# Patient Record
Sex: Male | Born: 1970 | Race: White | Hispanic: No | Marital: Married | Smoking: Current every day smoker
Health system: Southern US, Community
[De-identification: ages and names within clinical notes are randomized; demographics above are authoritative.]

## PROBLEM LIST (undated history)

## (undated) DIAGNOSIS — I2510 Atherosclerotic heart disease of native coronary artery without angina pectoris: Secondary | ICD-10-CM

## (undated) DIAGNOSIS — Z720 Tobacco use: Secondary | ICD-10-CM

## (undated) DIAGNOSIS — Z9289 Personal history of other medical treatment: Secondary | ICD-10-CM

## (undated) DIAGNOSIS — I255 Ischemic cardiomyopathy: Secondary | ICD-10-CM

## (undated) DIAGNOSIS — M5030 Other cervical disc degeneration, unspecified cervical region: Secondary | ICD-10-CM

## (undated) HISTORY — DX: Tobacco use: Z72.0

## (undated) HISTORY — DX: Ischemic cardiomyopathy: I25.5

## (undated) HISTORY — PX: COSMETIC SURGERY: SHX468

## (undated) HISTORY — DX: Atherosclerotic heart disease of native coronary artery without angina pectoris: I25.10

## (undated) HISTORY — DX: Personal history of other medical treatment: Z92.89

## (undated) HISTORY — DX: Other cervical disc degeneration, unspecified cervical region: M50.30

---

## 1999-09-13 ENCOUNTER — Emergency Department (HOSPITAL_COMMUNITY): Admission: EM | Admit: 1999-09-13 | Discharge: 1999-09-13 | Payer: Self-pay | Admitting: Emergency Medicine

## 2000-03-02 ENCOUNTER — Emergency Department (HOSPITAL_COMMUNITY): Admission: EM | Admit: 2000-03-02 | Discharge: 2000-03-02 | Payer: Self-pay | Admitting: Emergency Medicine

## 2002-09-19 ENCOUNTER — Emergency Department (HOSPITAL_COMMUNITY): Admission: EM | Admit: 2002-09-19 | Discharge: 2002-09-19 | Payer: Self-pay | Admitting: Podiatry

## 2002-11-05 ENCOUNTER — Emergency Department (HOSPITAL_COMMUNITY): Admission: EM | Admit: 2002-11-05 | Discharge: 2002-11-05 | Payer: Self-pay | Admitting: Emergency Medicine

## 2003-08-14 ENCOUNTER — Encounter: Payer: Self-pay | Admitting: Emergency Medicine

## 2003-08-14 ENCOUNTER — Emergency Department (HOSPITAL_COMMUNITY): Admission: AD | Admit: 2003-08-14 | Discharge: 2003-08-14 | Payer: Self-pay | Admitting: Emergency Medicine

## 2005-04-18 ENCOUNTER — Emergency Department (HOSPITAL_COMMUNITY): Admission: EM | Admit: 2005-04-18 | Discharge: 2005-04-18 | Payer: Self-pay | Admitting: Emergency Medicine

## 2006-02-14 ENCOUNTER — Emergency Department (HOSPITAL_COMMUNITY): Admission: EM | Admit: 2006-02-14 | Discharge: 2006-02-14 | Payer: Self-pay | Admitting: Emergency Medicine

## 2007-02-06 ENCOUNTER — Emergency Department (HOSPITAL_COMMUNITY): Admission: EM | Admit: 2007-02-06 | Discharge: 2007-02-07 | Payer: Self-pay | Admitting: Emergency Medicine

## 2007-02-12 ENCOUNTER — Emergency Department (HOSPITAL_COMMUNITY): Admission: EM | Admit: 2007-02-12 | Discharge: 2007-02-12 | Payer: Self-pay | Admitting: Emergency Medicine

## 2008-12-08 ENCOUNTER — Emergency Department (HOSPITAL_COMMUNITY): Admission: EM | Admit: 2008-12-08 | Discharge: 2008-12-08 | Payer: Self-pay | Admitting: Emergency Medicine

## 2010-06-16 ENCOUNTER — Emergency Department (HOSPITAL_COMMUNITY): Admission: EM | Admit: 2010-06-16 | Discharge: 2010-06-16 | Payer: Self-pay | Admitting: Emergency Medicine

## 2010-06-21 ENCOUNTER — Emergency Department (HOSPITAL_COMMUNITY): Admission: EM | Admit: 2010-06-21 | Discharge: 2010-06-21 | Payer: Self-pay | Admitting: Emergency Medicine

## 2010-12-17 ENCOUNTER — Emergency Department (HOSPITAL_COMMUNITY)
Admission: EM | Admit: 2010-12-17 | Discharge: 2010-12-17 | Payer: Self-pay | Source: Home / Self Care | Admitting: Emergency Medicine

## 2010-12-21 LAB — GC/CHLAMYDIA PROBE AMP, GENITAL: Chlamydia, DNA Probe: NEGATIVE

## 2015-04-06 ENCOUNTER — Emergency Department (INDEPENDENT_AMBULATORY_CARE_PROVIDER_SITE_OTHER)
Admission: EM | Admit: 2015-04-06 | Discharge: 2015-04-06 | Disposition: A | Discharge status: Home / Self Care | Payer: Self-pay | Source: Home / Self Care | Attending: Family Medicine | Admitting: Family Medicine

## 2015-04-06 ENCOUNTER — Encounter (HOSPITAL_COMMUNITY): Payer: Self-pay | Admitting: Emergency Medicine

## 2015-04-06 ENCOUNTER — Emergency Department (INDEPENDENT_AMBULATORY_CARE_PROVIDER_SITE_OTHER): Payer: Self-pay

## 2015-04-06 DIAGNOSIS — J40 Bronchitis, not specified as acute or chronic: Secondary | ICD-10-CM

## 2015-04-06 MED ORDER — IPRATROPIUM-ALBUTEROL 0.5-2.5 (3) MG/3ML IN SOLN
3.0000 mL | Freq: Once | RESPIRATORY_TRACT | Status: AC
  Administered 2015-04-06: 3 mL via RESPIRATORY_TRACT

## 2015-04-06 MED ORDER — ALBUTEROL SULFATE HFA 108 (90 BASE) MCG/ACT IN AERS: 2.0000 | INHALATION_SPRAY | Freq: Four times a day (QID) | RESPIRATORY_TRACT | Status: DC | PRN

## 2015-04-06 MED ORDER — IPRATROPIUM-ALBUTEROL 0.5-2.5 (3) MG/3ML IN SOLN
RESPIRATORY_TRACT | Status: AC
  Filled 2015-04-06: qty 3

## 2015-04-06 MED ORDER — PREDNISONE 5 MG PO TABS: 30.0000 mg | ORAL_TABLET | Freq: Every day | ORAL | Status: DC

## 2015-04-06 NOTE — Discharge Instructions (Signed)
Thank you for coming in today. Call or go to the emergency room if you get worse, have trouble breathing, have chest pains, or palpitations.  Please call or see Ms Antionette CharMaggy Mena for assistance with your bill.  You may qualify for reduced or free services.  Her phone number is [phone]. Her email is [email or web address] Please quit smoking.   Acute Bronchitis Bronchitis is inflammation of the airways that extend from the windpipe into the lungs (bronchi). The inflammation often causes mucus to develop. This leads to a cough, which is the most common symptom of bronchitis.  In acute bronchitis, the condition usually develops suddenly and goes away over time, usually in a couple weeks. Smoking, allergies, and asthma can make bronchitis worse. Repeated episodes of bronchitis may cause further lung problems.  CAUSES Acute bronchitis is most often caused by the same virus that causes a cold. The virus can spread from person to person (contagious) through coughing, sneezing, and touching contaminated objects. SIGNS AND SYMPTOMS   Cough.   Fever.   Coughing up mucus.   Body aches.   Chest congestion.   Chills.   Shortness of breath.   Sore throat.  DIAGNOSIS  Acute bronchitis is usually diagnosed through a physical exam. Your health care provider will also ask you questions about your medical history. Tests, such as chest X-rays, are sometimes done to rule out other conditions.  TREATMENT  Acute bronchitis usually goes away in a couple weeks. Oftentimes, no medical treatment is necessary. Medicines are sometimes given for relief of fever or cough. Antibiotic medicines are usually not needed but may be prescribed in certain situations. In some cases, an inhaler may be recommended to help reduce shortness of breath and control the cough. A cool mist vaporizer may also be used to help thin bronchial secretions and make it easier to clear the chest.  HOME CARE  INSTRUCTIONS  Get plenty of rest.   Drink enough fluids to keep your urine clear or pale yellow (unless you have a medical condition that requires fluid restriction). Increasing fluids may help thin your respiratory secretions (sputum) and reduce chest congestion, and it will prevent dehydration.   Take medicines only as directed by your health care provider.  If you were prescribed an antibiotic medicine, finish it all even if you start to feel better.  Avoid smoking and secondhand smoke. Exposure to cigarette smoke or irritating chemicals will make bronchitis worse. If you are a smoker, consider using nicotine gum or skin patches to help control withdrawal symptoms. Quitting smoking will help your lungs heal faster.   Reduce the chances of another bout of acute bronchitis by washing your hands frequently, avoiding people with cold symptoms, and trying not to touch your hands to your mouth, nose, or eyes.   Keep all follow-up visits as directed by your health care provider.  SEEK MEDICAL CARE IF: Your symptoms do not improve after 1 week of treatment.  SEEK IMMEDIATE MEDICAL CARE IF:  You develop an increased fever or chills.   You have chest pain.   You have severe shortness of breath.  You have bloody sputum.   You develop dehydration.  You faint or repeatedly feel like you are going to pass out.  You develop repeated vomiting.  You develop a severe headache. MAKE SURE YOU:   Understand these instructions.  Will watch your condition.  Will get help right away if you are not doing well or get worse. Document Released: 12/22/2004 Document Revised: 03/31/2014  Document Reviewed: 05/07/2013 [facility] Patient Information 2015 [place], [place]. This information is not intended to replace advice given to you by your health care provider. Make sure you discuss any questions you have with your health care provider.

## 2015-04-06 NOTE — ED Provider Notes (Signed)
Henry Walters is a 44 y.o. male who presents to Urgent Care today for cough. Patient is a 41 history of chest congestion nasal congestion and runny nose cough and wheezing. A few days ago he had central nonradiating nonexertional chest pain. 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 He denies any current shortness of breath or palpitations. No fevers or chills. He is a current smoker.   History reviewed. No pertinent past medical history. History reviewed. No pertinent past surgical history. History  Substance Use Topics  . Smoking status: Current Every Day Smoker  . Smokeless tobacco: Not on file  . Alcohol Use: No   ROS as above Medications: No current facility-administered medications for this encounter.   Current Outpatient Prescriptions  Medication Sig Dispense Refill  . albuterol (PROVENTIL HFA;VENTOLIN HFA) 108 (90 BASE) MCG/ACT inhaler Inhale 2 puffs into the lungs every 6 (six) hours as needed for wheezing or shortness of breath. 1 Inhaler 2  . predniSONE (DELTASONE) 5 MG tablet Take 6 tablets (30 mg total) by mouth daily. 30 tablet 0   No Known Allergies   Exam:  BP 128/76 mmHg  Pulse 76  Temp(Src) 98.8 F (37.1 C) (Oral)  Resp 18  SpO2 98% Gen: Well NAD HEENT: EOMI,  MMM posterior pharynx with cobblestoning. Normal tympanic membranes bilaterally. Lungs: Normal work of breathing. CTABL Heart: RRR no MRG Abd: NABS, Soft. Nondistended, Nontender Exts: Brisk capillary refill, warm and well perfused.   Patient was given a 2.5/0.5 mg DuoNeb nebulizer treatment and felt better  ED ECG REPORT   Date: 04/06/2015  Rate: 78  Rhythm: normal sinus rhythm and sinus arrhythmia  QRS Axis: normal  Intervals: normal  ST/T Wave abnormalities: nonspecific T wave changes and Flattened lateral T waves  Conduction Disutrbances:none  Narrative Interpretation:   Old EKG Reviewed: none available  I have personally reviewed the EKG tracing and  agree with the computerized printout as noted.  No results found for this or any previous visit (from the past 24 hour(s)). Dg Chest 2 View  04/06/2015   CLINICAL DATA:  Cough since Friday.  Right lung crackles.  EXAM: CHEST  2 VIEW  COMPARISON:  None.  FINDINGS: Normal heart size and mediastinal contours. No acute infiltrate or edema. No effusion or pneumothorax. No acute osseous findings.  IMPRESSION: Negative chest.   Electronically Signed   By: Marnee SpringJonathon  Watts M.D.   On: 04/06/2015 09:47    Assessment and Plan: 44 y.o. male with bronchitis. Treat with prednisone and albuterol. Follow-up with cardiology regarding EKG changes. Return as needed.   Discussed warning signs or symptoms. Please see discharge instructions. Patient expresses understanding.     Rodolph BongEvan S Criselda Starke, MD 04/06/15 [id number]

## 2015-04-06 NOTE — ED Notes (Signed)
C/o symptoms starting Thursday 5/5.  Chest soreness, stuffy head, sore throat, green productive cough. No pain or sob today, but prior to today has had episodes of sharp, intermittent, sob increased with activity.  Head very stuffy today

## 2015-04-17 ENCOUNTER — Ambulatory Visit (INDEPENDENT_AMBULATORY_CARE_PROVIDER_SITE_OTHER): Payer: Self-pay | Admitting: Cardiology

## 2015-04-17 ENCOUNTER — Encounter: Payer: Self-pay | Admitting: Cardiology

## 2015-04-17 VITALS — BP 132/78 | HR 70 | Ht 71.0 in | Wt 206.1 lb

## 2015-04-17 DIAGNOSIS — R079 Chest pain, unspecified: Secondary | ICD-10-CM

## 2015-04-17 DIAGNOSIS — Z720 Tobacco use: Secondary | ICD-10-CM

## 2015-04-17 DIAGNOSIS — R0789 Other chest pain: Secondary | ICD-10-CM

## 2015-04-17 DIAGNOSIS — R9431 Abnormal electrocardiogram [ECG] [EKG]: Secondary | ICD-10-CM | POA: Insufficient documentation

## 2015-04-17 LAB — LIPID PANEL
CHOL/HDL RATIO: 6
Cholesterol: 178 mg/dL (ref 0–200)
HDL: 31.7 mg/dL — ABNORMAL LOW (ref >39.00)
NONHDL: 146.3
Triglycerides: 223 mg/dL — ABNORMAL HIGH (ref 0.0–149.0)
VLDL: 44.6 mg/dL — ABNORMAL HIGH (ref 0.0–40.0)

## 2015-04-17 LAB — LDL CHOLESTEROL, DIRECT: LDL DIRECT: 119 mg/dL

## 2015-04-17 LAB — BASIC METABOLIC PANEL
BUN: 12 mg/dL (ref 6–23)
CALCIUM: 9.5 mg/dL (ref 8.4–10.5)
CO2: 28 mEq/L (ref 19–32)
CREATININE: 0.81 mg/dL (ref 0.40–1.50)
Chloride: 102 mEq/L (ref 96–112)
GFR: 110.36 mL/min (ref >60.00)
Glucose, Bld: 75 mg/dL (ref 70–99)
Potassium: 4 mEq/L (ref 3.5–5.1)
SODIUM: 137 meq/L (ref 135–145)

## 2015-04-17 LAB — HEPATIC FUNCTION PANEL
ALT: 20 U/L (ref 0–53)
AST: 14 U/L (ref 0–37)
Albumin: 3.9 g/dL (ref 3.5–5.2)
Alkaline Phosphatase: 82 U/L (ref 39–117)
BILIRUBIN DIRECT: 0.1 mg/dL (ref 0.0–0.3)
Total Bilirubin: 0.5 mg/dL (ref 0.2–1.2)
Total Protein: 7 g/dL (ref 6.0–8.3)

## 2015-04-17 MED ORDER — NITROGLYCERIN 0.4 MG SL SUBL: 0.4000 mg | SUBLINGUAL_TABLET | SUBLINGUAL | Status: DC | PRN

## 2015-04-17 NOTE — Patient Instructions (Addendum)
Medication Instructions:  START ASPIRIN 81 MG DAILY  Use your NTG under your tongue for recurrent chest pain. May take one tablet every 5 minutes. If you are still having discomfort after 3 tablets in 15 minutes, call 911. RX SENT TO PHARMACY   Labwork: LP/BMET/HFP  Testing/Procedures: Your physician has requested that you have en exercise stress myoview. For further information please visit [email or web address]. Please follow instruction sheet, as given.  Follow-Up: AS NEEDED    Smoking Cessation Quitting smoking is important to your health and has many advantages. However, it is not always easy to quit since nicotine is a very addictive drug. Oftentimes, people try 3 times or more before being able to quit. This document explains the best ways for you to prepare to quit smoking. Quitting takes hard work and a lot of effort, but you can do it. ADVANTAGES OF QUITTING SMOKING  You will live longer, feel better, and live better.  Your body will feel the impact of quitting smoking almost immediately.  Within 20 minutes, blood pressure decreases. Your pulse returns to its normal level.  After 8 hours, carbon monoxide levels in the blood return to normal. Your oxygen level increases.  After 24 hours, the chance of having a heart attack starts to decrease. Your breath, hair, and body stop smelling like smoke.  After 48 hours, damaged nerve endings begin to recover. Your sense of taste and smell improve.  After 72 hours, the body is virtually free of nicotine. Your bronchial tubes relax and breathing becomes easier.  After 2 to 12 weeks, lungs can hold more air. Exercise becomes easier and circulation improves.  The risk of having a heart attack, stroke, cancer, or lung disease is greatly reduced.  After 1 year, the risk of coronary heart disease is cut in half.  After 5 years, the risk of stroke falls to the same as a nonsmoker.  After 10 years, the risk of lung cancer is cut in  half and the risk of other cancers decreases significantly.  After 15 years, the risk of coronary heart disease drops, usually to the level of a nonsmoker.  If you are pregnant, quitting smoking will improve your chances of having a healthy baby.  The people you live with, especially any children, will be healthier.  You will have extra money to spend on things other than cigarettes. QUESTIONS TO THINK ABOUT BEFORE ATTEMPTING TO QUIT You may want to talk about your answers with your health care provider.  Why do you want to quit?  If you tried to quit in the past, what helped and what did not?  What will be the most difficult situations for you after you quit? How will you plan to handle them?  Who can help you through the tough times? Your family? Friends? A health care provider?  What pleasures do you get from smoking? What ways can you still get pleasure if you quit? Here are some questions to ask your health care provider:  How can you help me to be successful at quitting?  What medicine do you think would be best for me and how should I take it?  What should I do if I need more help?  What is smoking withdrawal like? How can I get information on withdrawal? GET READY  Set a quit date.  Change your environment by getting rid of all cigarettes, ashtrays, matches, and lighters in your home, car, or work. Do not let people smoke in your home.  Review your past attempts to quit. Think about what worked and what did not. GET SUPPORT AND ENCOURAGEMENT You have a better chance of being successful if you have help. You can get support in many ways.  Tell your family, friends, and coworkers that you are going to quit and need their support. Ask them not to smoke around you.  Get individual, group, or telephone counseling and support. Programs are available at [employer] and health centers. Call your local health department for information about programs in your  area.  Spiritual beliefs and practices may help some smokers quit.  Download a "quit meter" on your computer to keep track of quit statistics, such as how long you have gone without smoking, cigarettes not smoked, and money saved.  Get a self-help book about quitting smoking and staying off tobacco. LEARN NEW SKILLS AND BEHAVIORS  Distract yourself from urges to smoke. Talk to someone, go for a walk, or occupy your time with a task.  Change your normal routine. Take a different route to work. Drink tea instead of coffee. Eat breakfast in a different place.  Reduce your stress. Take a hot bath, exercise, or read a book.  Plan something enjoyable to do every day. Reward yourself for not smoking.  Explore interactive web-based programs that specialize in helping you quit. GET MEDICINE AND USE IT CORRECTLY Medicines can help you stop smoking and decrease the urge to smoke. Combining medicine with the above behavioral methods and support can greatly increase your chances of successfully quitting smoking.  Nicotine replacement therapy helps deliver nicotine to your body without the negative effects and risks of smoking. Nicotine replacement therapy includes nicotine gum, lozenges, inhalers, nasal sprays, and skin patches. Some may be available over-the-counter and others require a prescription.  Antidepressant medicine helps people abstain from smoking, but how this works is unknown. This medicine is available by prescription.  Nicotinic receptor partial agonist medicine simulates the effect of nicotine in your brain. This medicine is available by prescription. Ask your health care provider for advice about which medicines to use and how to use them based on your health history. Your health care provider will tell you what side effects to look out for if you choose to be on a medicine or therapy. Carefully read the information on the package. Do not use any other product containing nicotine while  using a nicotine replacement product.  RELAPSE OR DIFFICULT SITUATIONS Most relapses occur within the first 3 months after quitting. Do not be discouraged if you start smoking again. Remember, most people try several times before finally quitting. You may have symptoms of withdrawal because your body is used to nicotine. You may crave cigarettes, be irritable, feel very hungry, cough often, get headaches, or have difficulty concentrating. The withdrawal symptoms are only temporary. They are strongest when you first quit, but they will go away within 10-14 days. To reduce the chances of relapse, try to:  Avoid drinking alcohol. Drinking lowers your chances of successfully quitting.  Reduce the amount of caffeine you consume. Once you quit smoking, the amount of caffeine in your body increases and can give you symptoms, such as a rapid heartbeat, sweating, and anxiety.  Avoid smokers because they can make you want to smoke.  Do not let weight gain distract you. Many smokers will gain weight when they quit, usually less than 10 pounds. Eat a healthy diet and stay active. You can always lose the weight gained after you quit.  Find  ways to improve your mood other than smoking. FOR MORE INFORMATION  www.smokefree.gov  Document Released: 11/08/2001 Document Revised: 03/31/2014 Document Reviewed: 02/23/2012 [facility] Patient Information 2015 [place], [place]. This information is not intended to replace advice given to you by your health care provider. Make sure you discuss any questions you have with your health care provider.

## 2015-04-17 NOTE — Progress Notes (Signed)
Quick Note:  Please report to patient. The recent labs are stable. Continue same medication and careful diet. LDL cholesterol borderline high. Watch diet. ______

## 2015-04-17 NOTE — Progress Notes (Signed)
Cardiology Office Note   Date:  04/17/2015   ID:  Prince SolianMichael M Tregre, DOB 11-22-1971, MRN [id number]  PCP:  No primary care provider on file.  Cardiologist: Cassell Clementhomas Aleigh Grunden MD  No chief complaint on file.     History of Present Illness: Prince SolianMichael M Gaba is a 44 y.o. male who presents for evaluation of abnormal EKG.  This patient was referred from the urgent care Center.  He does not have a regular physician.  He does not see doctors on a regular basis.  He does not have any prior history of heart problems.  In early May he developed a cough reactive of green sputum.  He developed tightness and discomfort in his anterior chest.  He went to urgent care where his chest x-ray was normal.  No lab work was drawn.  His EKG showed some mild nonspecific T-wave changes.  He was treated with a course of prednisone Dosepak and was given a prescription for albuterol inhaler.  The inhaler was too expensive but his girlfriend also has an inhaler which he used for a few days.  7 he has returned to work.  He has not been experiencing any exertional chest pain.  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  The patient does not have any history of diabetes or high blood pressure.  He does not know what his cholesterol runs.  He has a history of cervical spine disease and he has some atrophy of his left arm.  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 The family history reveals that his mother is living in her 6160s.  The father is alive but health is unknown.  Father has a problem with alcohol. Social history reveals that the patient smokes one pack of cigarettes a day.  He does not drink much alcohol.    History reviewed. No pertinent past medical history.  History reviewed. No pertinent past surgical history.   Current Outpatient Prescriptions  Medication Sig Dispense Refill  . aspirin 81 MG tablet Take 81 mg by mouth daily.    .  nitroGLYCERIN (NITROSTAT) 0.4 MG SL tablet Place 1 tablet (0.4 mg total) under the tongue every 5 (five) minutes as needed for chest pain. 25 tablet prn   No current facility-administered medications for this visit.    Allergies:   Review of patient's allergies indicates no known allergies.    Social History:  The patient  reports that he has been smoking.  He does not have any smokeless tobacco history on file. He reports that he does not drink alcohol or use illicit drugs.   Family History:  The patient's family history includes Diabetes in his mother; Healthy in his sister.    ROS:  Please see the history of present illness.   Otherwise, review of systems are positive for none.   All other systems are reviewed and negative.    PHYSICAL EXAM: VS:  BP 132/78 mmHg  Pulse 70  Ht 5\' 11"  (1.803 m)  Wt 206 lb 1.9 oz (93.495 kg)  BMI 28.76 kg/m2 , BMI Body mass index is 28.76 kg/(m^2). GEN: Well nourished, well developed, in no acute distress HEENT: normal Neck: no JVD, carotid bruits, or masses Cardiac: RRR; no murmurs, rubs, or gallops,no edema  Respiratory:  clear to auscultation bilaterally, normal work of breathing GI: soft, nontender, nondistended, + BS MS: no deformity or atrophy Skin: warm and dry,  no rash Neuro:  Strength and sensation are intact Psych: euthymic mood, full affect   EKG:  EKG is ordered today. The ekg ordered today demonstrates normal sinus rhythm.  T-wave inversions in 1 aVL and V3 through V5 which have increased since the prior tracing of 04/06/15 and suggest possible anterolateral ischemia.   Recent Labs: No results found for requested labs within last 365 days.    Lipid Panel No results found for: CHOL, TRIG, HDL, CHOLHDL, VLDL, LDLCALC, LDLDIRECT    Wt Readings from Last 3 Encounters:  04/17/15 206 lb 1.9 oz (93.495 kg)        ASSESSMENT AND PLAN:  1.  Abnormal EKG 2.  Subacute bronchitis 3.  Tobacco abuse 4.  Chest pain uncertain  etiology   Current medicines are reviewed at length with the patient today.  The patient does not have concerns regarding medicines.  The following changes have been made:  We are starting him on a 81 mg aspirin daily.  He will also carry sublingual nitroglycerin 0.4 mg 4 as needed use. He was urged to stop smoking.  His girlfriend who has COPD and has home oxygen is also a smoker and needs to quit. We will have the patient get baseline lab work today including lipid panel, basal metabolic panel, and hepatic function panel.  He may need statin therapy. We will have him return for a treadmill Myoview stress test. The patient does not currently have any health insurance.  He is in the process of filling out papers for financial help. Recheck here as needed after above studies are available.  Labs/ tests ordered today include:  Orders Placed This Encounter  Procedures  . Lipid panel  . Hepatic function panel  . Basic metabolic panel  . Myocardial Perfusion Imaging  . EKG 12-Lead      Signed, Cassell Clementhomas Avyanna Spada MD 04/17/2015 2:40 PM    [facility] [address], [place], [place]  [address] Phone: [phone]) [phone]; Fax: [phone]

## 2015-04-22 ENCOUNTER — Telehealth: Payer: Self-pay | Admitting: Cardiology

## 2015-04-22 NOTE — Telephone Encounter (Signed)
Voice mail not set up, will try again later.

## 2015-04-22 NOTE — Telephone Encounter (Signed)
New Prob   Pt has some questions regarding a possible Cholesterol test he states he is supposed to have. Please call.

## 2015-04-22 NOTE — Telephone Encounter (Signed)
-----   Message from Cassell Clementhomas Brackbill, MD sent at 04/17/2015 10:20 PM EDT ----- Please report to patient.  The recent labs are stable. Continue same medication and careful diet. LDL cholesterol borderline high.  Watch diet.

## 2015-04-24 NOTE — Telephone Encounter (Signed)
No voicemail set up, will try again

## 2015-05-01 NOTE — Telephone Encounter (Signed)
Notes Recorded by Burnell BlanksMelinda B Mertice Uffelman on 04/29/2015 at 4:17 PM Called number in chart and states number disconnected or changed  Mailed copy of labs, any questions call office

## 2015-05-08 ENCOUNTER — Encounter (HOSPITAL_COMMUNITY): Payer: Self-pay

## 2015-05-20 ENCOUNTER — Telehealth (HOSPITAL_COMMUNITY): Payer: Self-pay | Admitting: *Deleted

## 2015-05-20 NOTE — Telephone Encounter (Signed)
Attempted to call patient regarding an upcoming appointment, voicemail has not been set up. Unable to reach patient. Antionette Char, RN

## 2015-05-22 ENCOUNTER — Telehealth (HOSPITAL_COMMUNITY): Payer: Self-pay

## 2015-05-22 NOTE — Telephone Encounter (Signed)
Patient given detailed instructions per Myocardial Perfusion Study Information Sheet for test on 05-25-2015 at 7:45am. Patient Notified to arrive 15 minutes early, and that it is imperative to arrive on time for appointment to keep from having the test rescheduled. Patient verbalized understanding. Randa Evens, Deanza Upperman A

## 2015-05-25 ENCOUNTER — Encounter (HOSPITAL_COMMUNITY): Payer: Self-pay

## 2015-06-10 ENCOUNTER — Telehealth (HOSPITAL_COMMUNITY): Payer: Self-pay | Admitting: *Deleted

## 2015-06-10 NOTE — Telephone Encounter (Signed)
Attempted to call patient in reference to upcoming appointment scheduled for 06/12/15- No answer. Antionette CharMary J Maccoy Haubner, RN

## 2015-06-12 ENCOUNTER — Ambulatory Visit (HOSPITAL_COMMUNITY): Payer: Self-pay | Attending: Cardiology

## 2015-06-12 DIAGNOSIS — R9439 Abnormal result of other cardiovascular function study: Secondary | ICD-10-CM | POA: Insufficient documentation

## 2015-06-12 DIAGNOSIS — R9431 Abnormal electrocardiogram [ECG] [EKG]: Secondary | ICD-10-CM

## 2015-06-12 DIAGNOSIS — R079 Chest pain, unspecified: Secondary | ICD-10-CM

## 2015-06-12 DIAGNOSIS — R0789 Other chest pain: Secondary | ICD-10-CM

## 2015-06-12 DIAGNOSIS — Z720 Tobacco use: Secondary | ICD-10-CM

## 2015-06-12 LAB — MYOCARDIAL PERFUSION IMAGING
CSEPED: 11 min
CSEPEDS: 0 s
CSEPEW: 13.4 METS
CSEPHR: 91 %
LVDIAVOL: 199 mL
LVSYSVOL: 127 mL
MPHR: 177 {beats}/min
Peak HR: 162 {beats}/min
RATE: 0.34
Rest HR: 64 {beats}/min
SDS: 2
SRS: 12
SSS: 14
TID: 1.05

## 2015-06-12 MED ORDER — TECHNETIUM TC 99M SESTAMIBI GENERIC - CARDIOLITE
32.8000 | Freq: Once | INTRAVENOUS | Status: AC | PRN
  Administered 2015-06-12: 32.8 via INTRAVENOUS

## 2015-06-12 MED ORDER — TECHNETIUM TC 99M SESTAMIBI GENERIC - CARDIOLITE
10.6000 | Freq: Once | INTRAVENOUS | Status: AC | PRN
  Administered 2015-06-12: 11 via INTRAVENOUS

## 2015-06-15 ENCOUNTER — Telehealth: Payer: Self-pay | Admitting: Cardiology

## 2015-06-15 NOTE — Telephone Encounter (Signed)
New message     Pt returning Jennifer's call regarding test results.   Please call to discuss

## 2015-06-15 NOTE — Telephone Encounter (Signed)
Discussed results of myoview done 06/12/15 with patient, see Dr Arsenio KatzBrackbill's myoview notes and recommendation. Pt states he cannot come to the office this week, pt given appt with Tereso NewcomerScott Weaver, PA,c 06/23/15 at 3PM.  Pt states he has not chest pain since seeing Dr Patty SermonsBrackbill, he does have NTG prescribed by Dr Patty SermonsBrackbill  that he carries with him. Pt advised to continue to carry NTG with him, to use it if he has chest pain. Pt advised if chest pain relieved by NTG to call our office and let us know, if NTG does not relieve chest pain to call 911.  Pt verbalized understanding.
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
## 2015-06-23 ENCOUNTER — Ambulatory Visit (INDEPENDENT_AMBULATORY_CARE_PROVIDER_SITE_OTHER): Payer: Self-pay | Admitting: Physician Assistant

## 2015-06-23 ENCOUNTER — Encounter: Payer: Self-pay | Admitting: Physician Assistant

## 2015-06-23 VITALS — BP 140/92 | HR 71 | Ht 70.0 in | Wt 209.0 lb

## 2015-06-23 DIAGNOSIS — Z720 Tobacco use: Secondary | ICD-10-CM

## 2015-06-23 DIAGNOSIS — R9439 Abnormal result of other cardiovascular function study: Secondary | ICD-10-CM

## 2015-06-23 DIAGNOSIS — R0789 Other chest pain: Secondary | ICD-10-CM

## 2015-06-23 LAB — CBC WITH DIFFERENTIAL/PLATELET
BASOS ABS: 0.1 10*3/uL (ref 0.0–0.1)
BASOS PCT: 0.9 % (ref 0.0–3.0)
EOS ABS: 0.3 10*3/uL (ref 0.0–0.7)
EOS PCT: 2.6 % (ref 0.0–5.0)
HEMATOCRIT: 45.2 % (ref 39.0–52.0)
Hemoglobin: 15.3 g/dL (ref 13.0–17.0)
LYMPHS ABS: 2.9 10*3/uL (ref 0.7–4.0)
LYMPHS PCT: 27.3 % (ref 12.0–46.0)
MCHC: 33.9 g/dL (ref 30.0–36.0)
MCV: 89 fl (ref 78.0–100.0)
Monocytes Absolute: 1 10*3/uL (ref 0.1–1.0)
Monocytes Relative: 9.1 % (ref 3.0–12.0)
NEUTROS ABS: 6.5 10*3/uL (ref 1.4–7.7)
Neutrophils Relative %: 60.1 % (ref 43.0–77.0)
Platelets: 281 10*3/uL (ref 150.0–400.0)
RBC: 5.08 Mil/uL (ref 4.22–5.81)
RDW: 14.2 % (ref 11.5–15.5)
WBC: 10.8 10*3/uL — AB (ref 4.0–10.5)

## 2015-06-23 LAB — BASIC METABOLIC PANEL
BUN: 12 mg/dL (ref 6–23)
CO2: 32 mEq/L (ref 19–32)
Calcium: 9.6 mg/dL (ref 8.4–10.5)
Chloride: 103 mEq/L (ref 96–112)
Creatinine, Ser: 0.88 mg/dL (ref 0.40–1.50)
GFR: 100.21 mL/min (ref >60.00)
Glucose, Bld: 64 mg/dL — ABNORMAL LOW (ref 70–99)
POTASSIUM: 4.1 meq/L (ref 3.5–5.1)
SODIUM: 141 meq/L (ref 135–145)

## 2015-06-23 MED ORDER — METOPROLOL SUCCINATE ER 25 MG PO TB24: 25.0000 mg | ORAL_TABLET | Freq: Every day | ORAL | Status: DC

## 2015-06-23 NOTE — Patient Instructions (Signed)
Medication Instructions:  Start Toprol XL ( 25 mg ) daily, sent in today to patient's requested pharmacy, [facility] on [address]  Labwork: Your physician recommends that you have lab work today: bmet/cbc/pt/inr/ptt   Testing/Procedures: You are scheduled for a cardiac catheterization on Friday, August 5 with Dr. Thomasene Lot or associate.  Go to [facility] Short Stay on Friday, August 5 at 10:00 am.  Enter thru the [address] entrance A No food or drink after midnight on Thursday, August 4. You may take your medications with a sip of water on the day of your procedure.    Follow-Up: Your physician recommends that you keep your scheduled  follow-up appointment with Tereso Newcomer, PA-C   Any Other Special Instructions Will Be Listed Below (If Applicable).

## 2015-06-24 ENCOUNTER — Telehealth: Payer: Self-pay | Admitting: *Deleted

## 2015-06-24 LAB — PROTIME-INR
INR: 0.9 ratio (ref 0.8–1.0)
Prothrombin Time: 10.5 s (ref 9.6–13.1)

## 2015-06-24 LAB — APTT: APTT: 28.8 s (ref 23.4–32.7)

## 2015-06-24 NOTE — Telephone Encounter (Signed)
Pt notified of lab results.Marland Kitchenok for cath..pt verbalized understanding .Vanetta Mulders 06-24-15

## 2015-07-03 ENCOUNTER — Encounter (HOSPITAL_COMMUNITY)
Admission: RE | Disposition: A | Discharge status: Home / Self Care | Payer: Self-pay | Source: Ambulatory Visit | Attending: Cardiology

## 2015-07-03 ENCOUNTER — Ambulatory Visit (HOSPITAL_COMMUNITY)
Admission: RE | Admit: 2015-07-03 | Discharge: 2015-07-03 | Disposition: A | Discharge status: Home / Self Care | Payer: Self-pay | Source: Ambulatory Visit | Attending: Cardiology | Admitting: Cardiology

## 2015-07-03 DIAGNOSIS — R079 Chest pain, unspecified: Secondary | ICD-10-CM | POA: Insufficient documentation

## 2015-07-03 DIAGNOSIS — Z7982 Long term (current) use of aspirin: Secondary | ICD-10-CM | POA: Insufficient documentation

## 2015-07-03 DIAGNOSIS — R9439 Abnormal result of other cardiovascular function study: Secondary | ICD-10-CM | POA: Insufficient documentation

## 2015-07-03 DIAGNOSIS — I252 Old myocardial infarction: Secondary | ICD-10-CM | POA: Insufficient documentation

## 2015-07-03 DIAGNOSIS — I2510 Atherosclerotic heart disease of native coronary artery without angina pectoris: Secondary | ICD-10-CM | POA: Insufficient documentation

## 2015-07-03 DIAGNOSIS — F17200 Nicotine dependence, unspecified, uncomplicated: Secondary | ICD-10-CM | POA: Insufficient documentation

## 2015-07-03 DIAGNOSIS — R0789 Other chest pain: Secondary | ICD-10-CM

## 2015-07-03 DIAGNOSIS — I2582 Chronic total occlusion of coronary artery: Secondary | ICD-10-CM | POA: Insufficient documentation

## 2015-07-03 HISTORY — PX: CARDIAC CATHETERIZATION: SHX172

## 2015-07-03 SURGERY — LEFT HEART CATH AND CORONARY ANGIOGRAPHY
Anesthesia: LOCAL

## 2015-07-03 MED ORDER — NITROGLYCERIN 1 MG/10 ML FOR IR/CATH LAB
INTRA_ARTERIAL | Status: AC
  Filled 2015-07-03: qty 10

## 2015-07-03 MED ORDER — MIDAZOLAM HCL 2 MG/2ML IJ SOLN
INTRAMUSCULAR | Status: DC | PRN
  Administered 2015-07-03: 2 mg via INTRAVENOUS

## 2015-07-03 MED ORDER — SODIUM CHLORIDE 0.9 % IV SOLN: 250.0000 mL | INTRAVENOUS | Status: DC | PRN

## 2015-07-03 MED ORDER — SODIUM CHLORIDE 0.9 % IV SOLN
INTRAVENOUS | Status: DC
  Administered 2015-07-03: 10:00:00 via INTRAVENOUS

## 2015-07-03 MED ORDER — ONDANSETRON HCL 4 MG/2ML IJ SOLN: 4.0000 mg | Freq: Four times a day (QID) | INTRAMUSCULAR | Status: DC | PRN

## 2015-07-03 MED ORDER — FENTANYL CITRATE (PF) 100 MCG/2ML IJ SOLN
INTRAMUSCULAR | Status: AC
  Filled 2015-07-03: qty 4

## 2015-07-03 MED ORDER — HEPARIN (PORCINE) IN NACL 2-0.9 UNIT/ML-% IJ SOLN
INTRAMUSCULAR | Status: DC | PRN
  Administered 2015-07-03: 13:00:00

## 2015-07-03 MED ORDER — FENTANYL CITRATE (PF) 100 MCG/2ML IJ SOLN
INTRAMUSCULAR | Status: DC | PRN
  Administered 2015-07-03: 50 ug via INTRAVENOUS

## 2015-07-03 MED ORDER — IOHEXOL 350 MG/ML SOLN
INTRAVENOUS | Status: DC | PRN
  Administered 2015-07-03: 100 mL via INTRAVENOUS

## 2015-07-03 MED ORDER — MIDAZOLAM HCL 2 MG/2ML IJ SOLN
INTRAMUSCULAR | Status: AC
  Filled 2015-07-03: qty 4

## 2015-07-03 MED ORDER — ACETAMINOPHEN 325 MG PO TABS: 650.0000 mg | ORAL_TABLET | ORAL | Status: DC | PRN

## 2015-07-03 MED ORDER — LIDOCAINE HCL (PF) 1 % IJ SOLN
INTRAMUSCULAR | Status: AC
  Filled 2015-07-03: qty 30

## 2015-07-03 MED ORDER — SODIUM CHLORIDE 0.9 % IJ SOLN: 3.0000 mL | Freq: Two times a day (BID) | INTRAMUSCULAR | Status: DC

## 2015-07-03 MED ORDER — LIDOCAINE HCL (PF) 1 % IJ SOLN
INTRAMUSCULAR | Status: DC | PRN
  Administered 2015-07-03: 5 mL

## 2015-07-03 MED ORDER — HEPARIN SODIUM (PORCINE) 1000 UNIT/ML IJ SOLN
INTRAMUSCULAR | Status: AC
  Filled 2015-07-03: qty 1

## 2015-07-03 MED ORDER — SODIUM CHLORIDE 0.9 % IV SOLN: INTRAVENOUS | Status: DC

## 2015-07-03 MED ORDER — SODIUM CHLORIDE 0.9 % IJ SOLN: 3.0000 mL | INTRAMUSCULAR | Status: DC | PRN

## 2015-07-03 MED ORDER — VERAPAMIL HCL 2.5 MG/ML IV SOLN
INTRAVENOUS | Status: AC
  Filled 2015-07-03: qty 2

## 2015-07-03 MED ORDER — HEPARIN SODIUM (PORCINE) 1000 UNIT/ML IJ SOLN
INTRAMUSCULAR | Status: DC | PRN
  Administered 2015-07-03: 5000 [IU] via INTRAVENOUS

## 2015-07-03 MED ORDER — ASPIRIN EC 81 MG PO TBEC
81.0000 mg | DELAYED_RELEASE_TABLET | Freq: Every day | ORAL | Status: DC
  Filled 2015-07-03: qty 1

## 2015-07-03 MED ORDER — ASPIRIN 81 MG PO CHEW: 81.0000 mg | CHEWABLE_TABLET | ORAL | Status: DC

## 2015-07-03 MED ORDER — RADIAL COCKTAIL (HEPARIN/VERAPAMIL/LIDOCAINE/NITRO)
Status: DC | PRN
  Administered 2015-07-03: 1 via INTRA_ARTERIAL

## 2015-07-03 SURGICAL SUPPLY — 11 items
CATH INFINITI 5FR ANG PIGTAIL (CATHETERS) ×3 IMPLANT
CATH INFINITI JR4 5F (CATHETERS) ×3 IMPLANT
CATH OPTITORQUE TIG 4.0 5F (CATHETERS) ×3 IMPLANT
DEVICE RAD COMP TR BAND LRG (VASCULAR PRODUCTS) ×3 IMPLANT
GLIDESHEATH SLEND A-KIT 6F 22G (SHEATH) ×3 IMPLANT
KIT HEART LEFT (KITS) ×3 IMPLANT
PACK CARDIAC CATHETERIZATION (CUSTOM PROCEDURE TRAY) ×3 IMPLANT
SYR MEDRAD MARK V 150ML (SYRINGE) ×3 IMPLANT
TRANSDUCER W/STOPCOCK (MISCELLANEOUS) ×3 IMPLANT
TUBING CIL FLEX 10 FLL-RA (TUBING) ×3 IMPLANT
WIRE SAFE-T 1.5MM-J .035X260CM (WIRE) ×3 IMPLANT

## 2015-07-03 NOTE — Discharge Instructions (Signed)
Radial Site Care °Refer to this sheet in the next few weeks. These instructions provide you with information on caring for yourself after your procedure. Your caregiver may also give you more specific instructions. Your treatment has been planned according to current medical practices, but problems sometimes occur. Call your caregiver if you have any problems or questions after your procedure. °HOME CARE INSTRUCTIONS °· You may shower the day after the procedure. Remove the bandage (dressing) and gently wash the site with plain soap and water. Gently pat the site dry. °· Do not apply powder or lotion to the site. °· Do not submerge the affected site in water for 3 to 5 days. °· Inspect the site at least twice daily. °· Do not flex or bend the affected arm for 24 hours. °· No lifting over 5 pounds (2.3 kg) for 5 days after your procedure. °· Do not drive home if you are discharged the same day of the procedure. Have someone else drive you. °· You may drive 24 hours after the procedure unless otherwise instructed by your caregiver. °· Do not operate machinery or power tools for 24 hours. °· A responsible adult should be with you for the first 24 hours after you arrive home. °What to expect: °· Any bruising will usually fade within 1 to 2 weeks. °· Blood that collects in the tissue (hematoma) may be painful to the touch. It should usually decrease in size and tenderness within 1 to 2 weeks. °SEEK IMMEDIATE MEDICAL CARE IF: °· You have unusual pain at the radial site. °· You have redness, warmth, swelling, or pain at the radial site. °· You have drainage (other than a small amount of blood on the dressing). °· You have chills. °· You have a fever or persistent symptoms for more than 72 hours. °· You have a fever and your symptoms suddenly get worse. °· Your arm becomes pale, cool, tingly, or numb. °· You have heavy bleeding from the site. Hold pressure on the site. °Document Released: 12/17/2010 Document Revised:  02/06/2012 Document Reviewed: 12/17/2010 °ExitCare® Patient Information ©2015 [employer]. This information is not intended to replace advice given to you by your health care provider. Make sure you discuss any questions you have with your health care provider. ° °
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
## 2015-07-03 NOTE — Interval H&P Note (Signed)
Cath Lab Visit (complete for each Cath Lab visit)  Clinical Evaluation Leading to the Procedure:   ACS: No.  Non-ACS:    Anginal Classification: CCS II  Anti-ischemic medical therapy: Minimal Therapy (1 class of medications)  Non-Invasive Test Results: High-risk stress test findings: cardiac mortality >3%/year  Prior CABG: No previous CABG      History and Physical Interval Note:  07/03/2015 12:00 PM  Prince Solian  has presented today for surgery, with the diagnosis of abnormal stress test  The various methods of treatment have been discussed with the patient and family. After consideration of risks, benefits and other options for treatment, the patient has consented to  Procedure(s): Left Heart Cath and Coronary Angiography (N/A) as a surgical intervention .  The patient's history has been reviewed, patient examined, no change in status, stable for surgery.  I have reviewed the patient's chart and labs.  Questions were answered to the patient's satisfaction.     Korver Graybeal A

## 2015-07-06 ENCOUNTER — Encounter (HOSPITAL_COMMUNITY): Payer: Self-pay | Admitting: Cardiovascular Disease

## 2015-07-06 MED FILL — Verapamil HCl IV Soln 2.5 MG/ML: INTRAVENOUS | Qty: 2 | Status: AC

## 2015-07-06 MED FILL — Nitroglycerin IV Soln 100 MCG/ML in D5W: INTRA_ARTERIAL | Qty: 10 | Status: AC
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
## 2015-07-14 ENCOUNTER — Ambulatory Visit (INDEPENDENT_AMBULATORY_CARE_PROVIDER_SITE_OTHER): Payer: Self-pay | Admitting: Physician Assistant

## 2015-07-14 ENCOUNTER — Encounter: Payer: Self-pay | Admitting: Physician Assistant

## 2015-07-14 VITALS — BP 138/92 | HR 74 | Ht 70.0 in | Wt 207.0 lb

## 2015-07-14 DIAGNOSIS — I255 Ischemic cardiomyopathy: Secondary | ICD-10-CM | POA: Insufficient documentation

## 2015-07-14 DIAGNOSIS — I2510 Atherosclerotic heart disease of native coronary artery without angina pectoris: Secondary | ICD-10-CM

## 2015-07-14 DIAGNOSIS — Z720 Tobacco use: Secondary | ICD-10-CM

## 2015-07-14 DIAGNOSIS — E785 Hyperlipidemia, unspecified: Secondary | ICD-10-CM

## 2015-07-14 MED ORDER — LISINOPRIL 2.5 MG PO TABS: 2.5000 mg | ORAL_TABLET | Freq: Every day | ORAL | Status: DC

## 2015-07-14 MED ORDER — CARVEDILOL 3.125 MG PO TABS: 3.1250 mg | ORAL_TABLET | Freq: Two times a day (BID) | ORAL | Status: DC

## 2015-07-14 MED ORDER — ATORVASTATIN CALCIUM 80 MG PO TABS: 80.0000 mg | ORAL_TABLET | Freq: Every day | ORAL | Status: DC

## 2015-07-14 NOTE — Patient Instructions (Addendum)
Medication Instructions:  1. STOP TORPOL XL  2. START COREG 3.125 MG TWICE DAILY  3. START ATORVASTATIN 80 MG DAILY  4. START LISINOPRIL 2.5 MG DAILY   Labwork: 1. IN 1 WEEK BMET  2. IN 6 WEEKS FASTING LIPID AND LIVER PANEL  Testing/Procedures: NONE  Follow-Up: 4-6 WEEKS WITH Franchelle Foskett, PAC SAME DAY DR. Patty Sermons IS IN THE OFFICE  Any Other Special Instructions Will Be Listed Below (If Applicable).    Cardiac Diet This diet can help prevent heart disease and stroke. Many factors influence your heart health, including eating and exercise habits. Coronary risk rises a lot with abnormal blood fat (lipid) levels. Cardiac meal planning includes limiting unhealthy fats, increasing healthy fats, and making other small dietary changes. General guidelines are as follows:  Adjust calorie intake to reach and maintain desirable body weight.  Limit total fat intake to less than 30% of total calories. Saturated fat should be less than 7% of calories.  Saturated fats are found in animal products and in some vegetable products. Saturated vegetable fats are found in coconut oil, cocoa butter, palm oil, and palm kernel oil. Read labels carefully to avoid these products as much as possible. Use butter in moderation. Choose tub margarines and oils that have 2 grams of fat or less. Good cooking oils are canola and olive oils.  Practice low-fat cooking techniques. Do not fry food. Instead, broil, bake, boil, steam, grill, roast on a rack, stir-fry, or microwave it. Other fat reducing suggestions include:  Remove the skin from poultry.  Remove all visible fat from meats.  Skim the fat off stews, soups, and gravies before serving them.  Steam vegetables in water or broth instead of sauting them in fat.  Avoid foods with trans fat (or hydrogenated oils), such as commercially fried foods and commercially baked goods. Commercial shortening and deep-frying fats will contain trans fat.  Increase  intake of fruits, vegetables, whole grains, and legumes to replace foods high in fat.  Increase consumption of nuts, legumes, and seeds to at least 4 servings weekly. One serving of a legume equals  cup, and 1 serving of nuts or seeds equals  cup.  Choose whole grains more often. Have 3 servings per day (a serving is 1 ounce [oz]).  Eat 4 to 5 servings of vegetables per day. A serving of vegetables is 1 cup of raw leafy vegetables;  cup of raw or cooked cut-up vegetables;  cup of vegetable juice.  Eat 4 to 5 servings of fruit per day. A serving of fruit is 1 medium whole fruit;  cup of dried fruit;  cup of fresh, frozen, or canned fruit;  cup of 100% fruit juice.  Increase your intake of dietary fiber to 20 to 30 grams per day. Insoluble fiber may help lower your risk of heart disease and may help curb your appetite.  Soluble fiber binds cholesterol to be removed from the blood. Foods high in soluble fiber are dried beans, citrus fruits, oats, apples, bananas, broccoli, Brussels sprouts, and eggplant.  Try to include foods fortified with plant sterols or stanols, such as yogurt, breads, juices, or margarines. Choose several fortified foods to achieve a daily intake of 2 to 3 grams of plant sterols or stanols.  Foods with omega-3 fats can help reduce your risk of heart disease. Aim to have a 3.5 oz portion of fatty fish twice per week, such as salmon, mackerel, albacore tuna, sardines, lake trout, or herring. If you wish to take  a fish oil supplement, choose one that contains 1 gram of both DHA and EPA.  Limit processed meats to 2 servings (3 oz portion) weekly.  Limit the sodium in your diet to 1500 milligrams (mg) per day. If you have high blood pressure, talk to a registered dietitian about a DASH (Dietary Approaches to Stop Hypertension) eating plan.  Limit sweets and beverages with added sugar, such as soda, to no more than 5 servings per week. One serving is:   1 tablespoon  sugar.  1 tablespoon jelly or jam.   cup sorbet.  1 cup lemonade.   cup regular soda. CHOOSING FOODS Starches  Allowed: Breads: All kinds (wheat, rye, raisin, white, oatmeal, [place], [place], and English muffin bread). Low-fat rolls: English muffins, frankfurter and hamburger buns, bagels, pita bread, tortillas (not fried). Pancakes, waffles, biscuits, and muffins made with recommended oil.  Avoid: Products made with saturated or trans fats, oils, or whole milk products. Butter rolls, cheese breads, croissants. Commercial doughnuts, muffins, sweet rolls, biscuits, waffles, pancakes, store-bought mixes. Crackers  Allowed: Low-fat crackers and snacks: Animal, graham, rye, saltine (with recommended oil, no lard), oyster, and matzo crackers. Bread sticks, melba toast, rusks, flatbread, pretzels, and light popcorn.  Avoid: High-fat crackers: cheese crackers, butter crackers, and those made with coconut, palm oil, or trans fat (hydrogenated oils). Buttered popcorn. Cereals  Allowed: Hot or cold whole-grain cereals.  Avoid: Cereals containing coconut, hydrogenated vegetable fat, or animal fat. Potatoes / Pasta / Rice  Allowed: All kinds of potatoes, rice, and pasta (such as macaroni, spaghetti, and noodles).  Avoid: Pasta or rice prepared with cream sauce or high-fat cheese. Chow mein noodles, [place] fries. Vegetables  Allowed: All vegetables and vegetable juices.  Avoid: Fried vegetables. Vegetables in cream, butter, or high-fat cheese sauces. Limit coconut. Fruit in cream or custard. Protein  Allowed: Limit your intake of meat, seafood, and poultry to no more than 6 oz (cooked weight) per day. All lean, well-trimmed beef, veal, pork, and lamb. All chicken and [place] without skin. All fish and shellfish. Wild game: wild duck, rabbit, pheasant, and venison. Egg whites or low-cholesterol egg substitutes may be used as desired. Meatless dishes: recipes with dried beans, peas, lentils,  and tofu (soybean curd). Seeds and nuts: all seeds and most nuts.  Avoid: Prime grade and other heavily marbled and fatty meats, such as short ribs, spare ribs, rib eye roast or steak, frankfurters, sausage, bacon, and high-fat luncheon meats, mutton. Caviar. Commercially fried fish. Domestic duck, goose, venison sausage. Organ meats: liver, gizzard, heart, chitterlings, brains, kidney, sweetbreads. Dairy  Allowed: Low-fat cheeses: nonfat or low-fat cottage cheese (1% or 2% fat), cheeses made with part skim milk, such as mozzarella, farmers, string, or ricotta. (Cheeses should be labeled no more than 2 to 6 grams fat per oz.). Skim (or 1%) milk: liquid, powdered, or evaporated. Buttermilk made with low-fat milk. Drinks made with skim or low-fat milk or cocoa. Chocolate milk or cocoa made with skim or low-fat (1%) milk. Nonfat or low-fat yogurt.  Avoid: Whole milk cheeses, including colby, cheddar, [place], [address], [place], [place], [place], [address], [place], and blue. Creamed cottage cheese, cream cheese. Whole milk and whole milk products, including buttermilk or yogurt made from whole milk, drinks made from whole milk. Condensed milk, evaporated whole milk, and 2% milk. Soups and Combination Foods  Allowed: Low-fat low-sodium soups: broth, dehydrated soups, homemade broth, soups with the fat removed, homemade cream soups made with skim or low-fat milk. Low-fat spaghetti, lasagna, chili,  and Spanish rice if low-fat ingredients and low-fat cooking techniques are used.  Avoid: Cream soups made with whole milk, cream, or high-fat cheese. All other soups. Desserts and Sweets  Allowed: Sherbet, fruit ices, gelatins, meringues, and angel food cake. Homemade desserts with recommended fats, oils, and milk products. Jam, jelly, honey, marmalade, sugars, and syrups. Pure sugar candy, such as gum drops, hard candy, jelly beans, marshmallows, mints, and small amounts of dark chocolate.  Avoid:  Commercially prepared cakes, pies, cookies, frosting, pudding, or mixes for these products. Desserts containing whole milk products, chocolate, coconut, lard, palm oil, or palm kernel oil. Ice cream or ice cream drinks. Candy that contains chocolate, coconut, butter, hydrogenated fat, or unknown ingredients. Buttered syrups. Fats and Oils  Allowed: Vegetable oils: safflower, sunflower, corn, soybean, cottonseed, sesame, canola, olive, or peanut. Non-hydrogenated margarines. Salad dressing or mayonnaise: homemade or commercial, made with a recommended oil. Low or nonfat salad dressing or mayonnaise.  Limit added fats and oils to 6 to 8 tsp per day (includes fats used in cooking, baking, salads, and spreads on bread). Remember to count the "hidden fats" in foods.  Avoid: Solid fats and shortenings: butter, lard, salt pork, bacon drippings. Gravy containing meat fat, shortening, or suet. Cocoa butter, coconut. Coconut oil, palm oil, palm kernel oil, or hydrogenated oils: these ingredients are often used in bakery products, nondairy creamers, whipped toppings, candy, and commercially fried foods. Read labels carefully. Salad dressings made of unknown oils, sour cream, or cheese, such as blue cheese and Roquefort. Cream, all kinds: half-and-half, light, heavy, or whipping. Sour cream or cream cheese (even if "light" or low-fat). Nondairy cream substitutes: coffee creamers and sour cream substitutes made with palm, palm kernel, hydrogenated oils, or coconut oil. Beverages  Allowed: Coffee (regular or decaffeinated), tea. Diet carbonated beverages, mineral water. Alcohol: Check with your caregiver. Moderation is recommended.  Avoid: Whole milk, regular sodas, and juice drinks with added sugar. Condiments  Allowed: All seasonings and condiments. Cocoa powder. "Cream" sauces made with recommended ingredients.  Avoid: Carob powder made with hydrogenated fats. SAMPLE MENU Breakfast   cup orange juice    cup oatmeal  1 slice toast  1 tsp margarine  1 cup skim milk Lunch  [place] sandwich with 2 oz [place], 2 slices bread  Lettuce and tomato slices  Fresh fruit  Carrot sticks  Coffee or tea Snack  Fresh fruit or low-fat crackers Dinner  3 oz lean ground beef  1 baked potato  1 tsp margarine   cup asparagus  Lettuce salad  1 tbs non-creamy dressing   cup peach slices  1 cup skim milk Document Released: 08/23/2008 Document Revised: 05/15/2012 Document Reviewed: 01/14/2014 Exit[employer] Patient Information 2015 [place], [place]. This information is not intended to replace advice given to you by your health care provider. Make sure you discuss any questions you have with your health care provider.

## 2015-07-21 ENCOUNTER — Other Ambulatory Visit: Payer: Self-pay

## 2015-07-31 ENCOUNTER — Other Ambulatory Visit (INDEPENDENT_AMBULATORY_CARE_PROVIDER_SITE_OTHER): Payer: Self-pay

## 2015-07-31 DIAGNOSIS — I255 Ischemic cardiomyopathy: Secondary | ICD-10-CM

## 2015-07-31 LAB — BASIC METABOLIC PANEL WITH GFR
BUN: 15 mg/dL (ref 6–23)
CO2: 28 meq/L (ref 19–32)
Calcium: 9.4 mg/dL (ref 8.4–10.5)
Chloride: 106 meq/L (ref 96–112)
Creatinine, Ser: 0.91 mg/dL (ref 0.40–1.50)
GFR: 96.36 mL/min
Glucose, Bld: 97 mg/dL (ref 70–99)
Potassium: 3.5 meq/L (ref 3.5–5.1)
Sodium: 142 meq/L (ref 135–145)

## 2015-08-04 ENCOUNTER — Telehealth: Payer: Self-pay | Admitting: *Deleted

## 2015-08-04 NOTE — Telephone Encounter (Signed)
Pt notified of lab results by phone with verbal understanding.  

## 2015-08-26 ENCOUNTER — Other Ambulatory Visit (INDEPENDENT_AMBULATORY_CARE_PROVIDER_SITE_OTHER): Payer: Self-pay | Admitting: *Deleted

## 2015-08-26 DIAGNOSIS — E785 Hyperlipidemia, unspecified: Secondary | ICD-10-CM

## 2015-08-26 DIAGNOSIS — I2510 Atherosclerotic heart disease of native coronary artery without angina pectoris: Secondary | ICD-10-CM

## 2015-08-26 LAB — HEPATIC FUNCTION PANEL
ALT: 22 U/L (ref 0–53)
AST: 17 U/L (ref 0–37)
Albumin: 4.1 g/dL (ref 3.5–5.2)
Alkaline Phosphatase: 72 U/L (ref 39–117)
BILIRUBIN DIRECT: 0.2 mg/dL (ref 0.0–0.3)
Total Bilirubin: 0.8 mg/dL (ref 0.2–1.2)
Total Protein: 6.9 g/dL (ref 6.0–8.3)

## 2015-08-26 LAB — LIPID PANEL
CHOLESTEROL: 92 mg/dL (ref 0–200)
HDL: 33.1 mg/dL — ABNORMAL LOW (ref >39.00)
LDL CALC: 37 mg/dL (ref 0–99)
NonHDL: 58.71
TRIGLYCERIDES: 110 mg/dL (ref 0.0–149.0)
Total CHOL/HDL Ratio: 3
VLDL: 22 mg/dL (ref 0.0–40.0)

## 2015-08-28 ENCOUNTER — Telehealth: Payer: Self-pay | Admitting: *Deleted

## 2015-08-28 ENCOUNTER — Encounter: Payer: Self-pay | Admitting: *Deleted

## 2015-08-28 NOTE — Telephone Encounter (Signed)
Pt notified of lab results by phone.  

## 2015-08-28 NOTE — Telephone Encounter (Signed)
Pt notified of lab reuslts by phone with verbal understanding 
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
## 2015-09-02 ENCOUNTER — Encounter: Payer: Self-pay | Admitting: Physician Assistant

## 2015-09-02 ENCOUNTER — Ambulatory Visit (INDEPENDENT_AMBULATORY_CARE_PROVIDER_SITE_OTHER): Payer: Self-pay | Admitting: Physician Assistant

## 2015-09-02 VITALS — BP 142/80 | HR 68 | Ht 70.0 in | Wt 208.8 lb

## 2015-09-02 DIAGNOSIS — Z720 Tobacco use: Secondary | ICD-10-CM

## 2015-09-02 DIAGNOSIS — E785 Hyperlipidemia, unspecified: Secondary | ICD-10-CM

## 2015-09-02 DIAGNOSIS — I255 Ischemic cardiomyopathy: Secondary | ICD-10-CM

## 2015-09-02 DIAGNOSIS — I2510 Atherosclerotic heart disease of native coronary artery without angina pectoris: Secondary | ICD-10-CM

## 2015-09-02 MED ORDER — LISINOPRIL 5 MG PO TABS: 5.0000 mg | ORAL_TABLET | Freq: Two times a day (BID) | ORAL | Status: DC

## 2015-09-02 NOTE — Patient Instructions (Addendum)
Medication Instructions:  **Increase Lisinopril to 5 mg Twice daily** (You can take 2 of the 2.5 mg tablets to equal 5 mg). Continue all of your other medications the same.  Labwork: In 1 Week:  BMET  Testing/Procedures: Your physician has requested that you have an echocardiogram THIS IS TO BE DONE AFTER 10/03/15; PT WILL NEED TO HAVE APPT WITH DR. Patty Walters 1 WEEK AFTER ECHO  Echocardiography is a painless test that uses sound waves to create images of your heart. It provides your doctor with information about the size and shape of your heart and how well your heart's chambers and valves are working. This procedure takes approximately one hour. There are no restrictions for this procedure.   Schedule Echocardiogram AFTER 10/03/15.   Follow-Up: Dr. Cassell Walters in November 2016 after the Echocardiogram.   Any Other Special Instructions Will Be Listed Below (If Applicable).

## 2015-09-09 ENCOUNTER — Other Ambulatory Visit (INDEPENDENT_AMBULATORY_CARE_PROVIDER_SITE_OTHER): Payer: Self-pay

## 2015-09-09 DIAGNOSIS — I2510 Atherosclerotic heart disease of native coronary artery without angina pectoris: Secondary | ICD-10-CM

## 2015-09-09 LAB — BASIC METABOLIC PANEL
BUN: 13 mg/dL (ref 7–25)
CHLORIDE: 103 mmol/L (ref 98–110)
CO2: 26 mmol/L (ref 20–31)
Calcium: 9.2 mg/dL (ref 8.6–10.3)
Creat: 0.78 mg/dL (ref 0.60–1.35)
GLUCOSE: 86 mg/dL (ref 65–99)
POTASSIUM: 3.8 mmol/L (ref 3.5–5.3)
SODIUM: 138 mmol/L (ref 135–146)

## 2015-09-10 ENCOUNTER — Telehealth: Payer: Self-pay | Admitting: *Deleted

## 2015-09-10 NOTE — Telephone Encounter (Signed)
Pt notified of lab results by phone with verbal understanding.  

## 2015-10-20 ENCOUNTER — Other Ambulatory Visit (HOSPITAL_COMMUNITY): Payer: Self-pay

## 2015-10-23 ENCOUNTER — Other Ambulatory Visit (HOSPITAL_COMMUNITY): Payer: Self-pay

## 2015-10-26 ENCOUNTER — Ambulatory Visit: Payer: Self-pay | Admitting: Cardiology

## 2015-11-09 ENCOUNTER — Other Ambulatory Visit: Payer: Self-pay

## 2015-11-09 ENCOUNTER — Other Ambulatory Visit (HOSPITAL_COMMUNITY): Payer: Self-pay | Admitting: *Deleted

## 2015-11-09 ENCOUNTER — Ambulatory Visit (HOSPITAL_COMMUNITY): Payer: Self-pay | Attending: Cardiology

## 2015-11-09 ENCOUNTER — Encounter: Payer: Self-pay | Admitting: Physician Assistant

## 2015-11-09 DIAGNOSIS — I2510 Atherosclerotic heart disease of native coronary artery without angina pectoris: Secondary | ICD-10-CM | POA: Insufficient documentation

## 2015-11-09 DIAGNOSIS — I255 Ischemic cardiomyopathy: Secondary | ICD-10-CM

## 2015-11-09 DIAGNOSIS — F17200 Nicotine dependence, unspecified, uncomplicated: Secondary | ICD-10-CM | POA: Insufficient documentation

## 2015-11-09 DIAGNOSIS — I517 Cardiomegaly: Secondary | ICD-10-CM | POA: Insufficient documentation

## 2015-11-09 MED ORDER — PERFLUTREN LIPID MICROSPHERE
1.0000 mL | INTRAVENOUS | Status: AC | PRN
  Administered 2015-11-09: 2 mL via INTRAVENOUS

## 2015-11-10 ENCOUNTER — Telehealth: Payer: Self-pay | Admitting: *Deleted

## 2015-11-10 DIAGNOSIS — I255 Ischemic cardiomyopathy: Secondary | ICD-10-CM

## 2015-11-10 DIAGNOSIS — I2510 Atherosclerotic heart disease of native coronary artery without angina pectoris: Secondary | ICD-10-CM

## 2015-11-10 MED ORDER — SPIRONOLACTONE 25 MG PO TABS: ORAL_TABLET | ORAL | Status: DC

## 2015-11-10 NOTE — Telephone Encounter (Signed)
Notes Recorded by Beatrice LecherScott T Weaver, PA-C on 11/09/2015 at 9:35 PM Please tell patient that his echo shows improved LVF. EF is now 40-45%. I would like to advance his treatment for his cardiomyopathy. I want him to start taking Spironolactone 12.5 mg QD in addition to his other medications. Please have him get a BMET 5 days and 12 days after his 1st dose of Spironolactone. Tereso NewcomerScott Weaver, PA-C  11/09/2015 9:34 PM

## 2015-11-18 ENCOUNTER — Other Ambulatory Visit (INDEPENDENT_AMBULATORY_CARE_PROVIDER_SITE_OTHER): Payer: Self-pay

## 2015-11-18 DIAGNOSIS — I255 Ischemic cardiomyopathy: Secondary | ICD-10-CM

## 2015-11-19 ENCOUNTER — Other Ambulatory Visit (INDEPENDENT_AMBULATORY_CARE_PROVIDER_SITE_OTHER): Payer: Self-pay | Admitting: *Deleted

## 2015-11-19 ENCOUNTER — Other Ambulatory Visit: Payer: Self-pay | Admitting: *Deleted

## 2015-11-19 DIAGNOSIS — R9431 Abnormal electrocardiogram [ECG] [EKG]: Secondary | ICD-10-CM

## 2015-11-19 DIAGNOSIS — I255 Ischemic cardiomyopathy: Secondary | ICD-10-CM

## 2015-11-19 DIAGNOSIS — I2583 Coronary atherosclerosis due to lipid rich plaque: Principal | ICD-10-CM

## 2015-11-19 DIAGNOSIS — I2510 Atherosclerotic heart disease of native coronary artery without angina pectoris: Secondary | ICD-10-CM

## 2015-11-19 LAB — BASIC METABOLIC PANEL
BUN: 13 mg/dL (ref 7–25)
CALCIUM: 8.9 mg/dL (ref 8.6–10.3)
CO2: 25 mmol/L (ref 20–31)
Chloride: 107 mmol/L (ref 98–110)
Creat: 0.85 mg/dL (ref 0.60–1.35)
GLUCOSE: 85 mg/dL (ref 65–99)
Potassium: 3.8 mmol/L (ref 3.5–5.3)
SODIUM: 140 mmol/L (ref 135–146)

## 2015-11-19 NOTE — Addendum Note (Signed)
Addended by: Tonita PhoenixBOWDEN, ROBIN K on: 11/19/2015 04:01 PM   Modules accepted: Orders

## 2015-11-19 NOTE — Addendum Note (Signed)
Addended by: BOWDEN, ROBIN K on: 11/19/2015 04:01 PM   Modules accepted: Orders  

## 2015-11-20 ENCOUNTER — Telehealth: Payer: Self-pay | Admitting: *Deleted

## 2015-11-20 NOTE — Telephone Encounter (Signed)
Pt notified of lab results by phone with verbal understanding.  

## 2015-11-25 ENCOUNTER — Other Ambulatory Visit: Payer: Self-pay

## 2015-11-27 ENCOUNTER — Other Ambulatory Visit: Payer: Self-pay | Admitting: *Deleted

## 2015-11-27 DIAGNOSIS — Z720 Tobacco use: Secondary | ICD-10-CM

## 2015-11-27 DIAGNOSIS — I255 Ischemic cardiomyopathy: Secondary | ICD-10-CM

## 2015-11-27 DIAGNOSIS — R9431 Abnormal electrocardiogram [ECG] [EKG]: Secondary | ICD-10-CM

## 2015-11-27 DIAGNOSIS — R9439 Abnormal result of other cardiovascular function study: Secondary | ICD-10-CM

## 2015-11-27 LAB — BASIC METABOLIC PANEL
BUN: 16 mg/dL (ref 7–25)
CALCIUM: 9 mg/dL (ref 8.6–10.3)
CO2: 27 mmol/L (ref 20–31)
CREATININE: 0.85 mg/dL (ref 0.60–1.35)
Chloride: 104 mmol/L (ref 98–110)
Glucose, Bld: 87 mg/dL (ref 65–99)
Potassium: 3.9 mmol/L (ref 3.5–5.3)
SODIUM: 135 mmol/L (ref 135–146)

## 2015-11-27 NOTE — Addendum Note (Signed)
Addended by: Tonita PhoenixBOWDEN, Elliott Lasecki K on: 11/27/2015 03:55 PM   Modules accepted: Orders

## 2015-12-01 ENCOUNTER — Telehealth: Payer: Self-pay | Admitting: *Deleted

## 2015-12-01 NOTE — Telephone Encounter (Signed)
Pt notified of lab results by phone with verbal understanding.  

## 2015-12-01 NOTE — Telephone Encounter (Signed)
VM not set up on [phone]. [facility] on [phone] for lab

## 2015-12-04 ENCOUNTER — Telehealth: Payer: Self-pay | Admitting: Cardiology

## 2015-12-04 NOTE — Telephone Encounter (Signed)
New message ° ° ° ° ° ° °Returning a call to get test results °

## 2015-12-04 NOTE — Telephone Encounter (Signed)
Patient called back, was returning call from old message  Patient aware of recent lab results

## 2015-12-04 NOTE — Telephone Encounter (Signed)
Called patient, no voice mail set up Will try again later

## 2015-12-17 ENCOUNTER — Ambulatory Visit: Payer: Self-pay | Admitting: Cardiology

## 2015-12-30 ENCOUNTER — Ambulatory Visit: Payer: Self-pay | Admitting: Cardiology

## 2016-01-01 ENCOUNTER — Ambulatory Visit: Payer: Self-pay | Admitting: Cardiology

## 2016-01-08 ENCOUNTER — Encounter: Payer: Self-pay | Admitting: Cardiology

## 2016-01-08 ENCOUNTER — Ambulatory Visit (INDEPENDENT_AMBULATORY_CARE_PROVIDER_SITE_OTHER): Payer: Self-pay | Admitting: Cardiology

## 2016-01-08 VITALS — BP 140/98 | HR 71 | Ht 71.0 in | Wt 212.4 lb

## 2016-01-08 DIAGNOSIS — I10 Essential (primary) hypertension: Secondary | ICD-10-CM

## 2016-01-08 DIAGNOSIS — Z720 Tobacco use: Secondary | ICD-10-CM

## 2016-01-08 DIAGNOSIS — I255 Ischemic cardiomyopathy: Secondary | ICD-10-CM

## 2016-01-08 MED ORDER — LISINOPRIL 10 MG PO TABS: 10.0000 mg | ORAL_TABLET | Freq: Two times a day (BID) | ORAL | Status: DC

## 2016-01-08 NOTE — Patient Instructions (Signed)
Medication Instructions:  INCREASE YOUR LISINOPRIL TO 10 MG TWICE A DAY   Labwork: NONE  Testing/Procedures: NONE  Follow-Up: Your physician recommends that you schedule a follow-up appointment in: 3 MONTH OV/EKG/BMET WITH DR Eden Emms  If you need a refill on your cardiac medications before your next appointment, please call your pharmacy.
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
## 2016-01-11 ENCOUNTER — Ambulatory Visit: Payer: Self-pay | Admitting: Cardiology
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
## 2016-04-06 ENCOUNTER — Ambulatory Visit (HOSPITAL_COMMUNITY)
Admission: RE | Admit: 2016-04-06 | Discharge: 2016-04-06 | Disposition: A | Discharge status: Home / Self Care | Payer: Self-pay | Source: Ambulatory Visit | Attending: Cardiovascular Disease | Admitting: Cardiovascular Disease

## 2016-04-06 ENCOUNTER — Ambulatory Visit (INDEPENDENT_AMBULATORY_CARE_PROVIDER_SITE_OTHER): Payer: Self-pay | Admitting: Cardiovascular Disease

## 2016-04-06 ENCOUNTER — Encounter: Payer: Self-pay | Admitting: Cardiovascular Disease

## 2016-04-06 VITALS — BP 118/90 | HR 74 | Ht 71.0 in | Wt 212.0 lb

## 2016-04-06 DIAGNOSIS — Z720 Tobacco use: Secondary | ICD-10-CM

## 2016-04-06 DIAGNOSIS — IMO0001 Reserved for inherently not codable concepts without codable children: Secondary | ICD-10-CM

## 2016-04-06 DIAGNOSIS — J449 Chronic obstructive pulmonary disease, unspecified: Secondary | ICD-10-CM | POA: Insufficient documentation

## 2016-04-06 DIAGNOSIS — F17200 Nicotine dependence, unspecified, uncomplicated: Secondary | ICD-10-CM | POA: Insufficient documentation

## 2016-04-06 DIAGNOSIS — Z87891 Personal history of nicotine dependence: Secondary | ICD-10-CM

## 2016-04-06 DIAGNOSIS — I2510 Atherosclerotic heart disease of native coronary artery without angina pectoris: Secondary | ICD-10-CM

## 2016-04-06 MED ORDER — NITROGLYCERIN 0.4 MG SL SUBL: 0.4000 mg | SUBLINGUAL_TABLET | SUBLINGUAL | Status: DC | PRN

## 2016-04-06 NOTE — Patient Instructions (Addendum)
Medication Instructions:  Your physician recommends that you continue on your current medications as directed. Please refer to the Current Medication list given to you today.  Labwork: Your physician recommends that you return for lab work next week for fasting liver and lipid.  Testing/Procedures: A chest x-ray takes a picture of the organs and structures inside the chest, including the heart, lungs, and blood vessels. This test can show several things, including, whether the heart is enlarges; whether fluid is building up in the lungs; and whether pacemaker / defibrillator leads are still in place.  Follow-Up: Your physician wants you to follow-up in: 6 months with Dr. Eden EmmsNishan. You will receive a reminder letter in the mail two months in advance. If you don't receive a letter, please call our office to schedule the follow-up appointment.  If you need a refill on your cardiac medications before your next appointment, please call your pharmacy.

## 2016-04-11 ENCOUNTER — Other Ambulatory Visit (INDEPENDENT_AMBULATORY_CARE_PROVIDER_SITE_OTHER): Payer: Self-pay | Admitting: *Deleted

## 2016-04-11 DIAGNOSIS — I255 Ischemic cardiomyopathy: Secondary | ICD-10-CM

## 2016-04-11 DIAGNOSIS — I2510 Atherosclerotic heart disease of native coronary artery without angina pectoris: Secondary | ICD-10-CM

## 2016-04-11 LAB — LIPID PANEL
CHOLESTEROL: 99 mg/dL — AB (ref 125–200)
HDL: 32 mg/dL — ABNORMAL LOW (ref >=40)
LDL Cholesterol: 51 mg/dL (ref <130)
Total CHOL/HDL Ratio: 3.1 Ratio (ref <=5.0)
Triglycerides: 79 mg/dL (ref <150)
VLDL: 16 mg/dL (ref <30)

## 2016-04-11 LAB — HEPATIC FUNCTION PANEL
ALT: 23 U/L (ref 9–46)
AST: 18 U/L (ref 10–40)
Albumin: 4 g/dL (ref 3.6–5.1)
Alkaline Phosphatase: 80 U/L (ref 40–115)
BILIRUBIN DIRECT: 0.2 mg/dL (ref <=0.2)
BILIRUBIN INDIRECT: 0.5 mg/dL (ref 0.2–1.2)
BILIRUBIN TOTAL: 0.7 mg/dL (ref 0.2–1.2)
Total Protein: 6.1 g/dL (ref 6.1–8.1)

## 2016-04-11 NOTE — Addendum Note (Signed)
Addended by: Tonita PhoenixBOWDEN, Chimere Klingensmith K on: 04/11/2016 08:15 AM   Modules accepted: Orders

## 2016-04-12 ENCOUNTER — Telehealth: Payer: Self-pay | Admitting: Cardiovascular Disease

## 2016-04-12 ENCOUNTER — Telehealth: Payer: Self-pay

## 2016-04-12 DIAGNOSIS — Z79899 Other long term (current) drug therapy: Secondary | ICD-10-CM

## 2016-04-12 NOTE — Telephone Encounter (Signed)
-----   Message from Wendall StadePeter C Nishan, MD sent at 04/12/2016  1:52 PM EDT ----- Cholesterol is at goal and LFT's are normal F/U labs in 6 months

## 2016-04-12 NOTE — Telephone Encounter (Signed)
Follow-up      Pt calling back returning the nurses phone about lab work.

## 2016-04-12 NOTE — Telephone Encounter (Signed)
Patient is aware of lab results. Will order labs for patient to have done with his 6 month follow-up.

## 2016-04-12 NOTE — Telephone Encounter (Signed)
Patient aware of lab and chest xray results.

## 2016-07-22 ENCOUNTER — Other Ambulatory Visit: Payer: Self-pay | Admitting: Physician Assistant

## 2016-07-22 DIAGNOSIS — I255 Ischemic cardiomyopathy: Secondary | ICD-10-CM

## 2016-07-22 DIAGNOSIS — E785 Hyperlipidemia, unspecified: Secondary | ICD-10-CM

## 2016-10-03 ENCOUNTER — Other Ambulatory Visit: Payer: Self-pay

## 2016-10-03 DIAGNOSIS — I255 Ischemic cardiomyopathy: Secondary | ICD-10-CM

## 2016-10-03 MED ORDER — LISINOPRIL 10 MG PO TABS
10.0000 mg | ORAL_TABLET | Freq: Two times a day (BID) | ORAL | 6 refills | Status: DC
Start: 2016-10-03 — End: 2017-06-08

## 2016-10-03 NOTE — Telephone Encounter (Signed)
Refilled Lisinopril 10 mg BID - OK per Nishan's OV note 04/06/16    Current Outpatient Prescriptions  Medication Sig Dispense Refill  . aspirin 81 MG tablet Take 81 mg by mouth daily.    Marland Kitchen. atorvastatin (LIPITOR) 80 MG tablet Take 1 tablet (80 mg total) by mouth daily. 30 tablet 11  . carvedilol (COREG) 3.125 MG tablet Take 1 tablet (3.125 mg total) by mouth 2 (two) times daily. 60 tablet 11  . lisinopril (PRINIVIL,ZESTRIL) 10 MG tablet Take 1 tablet (10 mg total) by mouth 2 (two) times daily. 60 tablet 5  . nitroGLYCERIN (NITROSTAT) 0.4 MG SL tablet Place 1 tablet (0.4 mg total) under the tongue every 5 (five) minutes as needed for chest pain. 25 tablet prn  . spironolactone (ALDACTONE) 25 MG tablet 1/2 tablet (12.5mg ) daily 15 tablet 1   No current facility-administered medications for this visit.    Allergies:   Review of patient's allergies indicates no known allergies.    Social History:  The patient  reports that he has been smoking.  He has never used smokeless tobacco. He reports that he drinks about 0.6 oz of alcohol per week. He reports that he uses illicit drugs.   Family History:  The patient's family history includes Diabetes in his mother; Healthy in his sister.    ROS:  Please see the history of present illness.   Otherwise, review of systems are positive for none.   All other systems are reviewed and negative.    PHYSICAL EXAM: VS:  BP 118/90 mmHg  Pulse 74  Ht 5\' 11"  (1.803 m)  Wt 96.163 kg (212 lb)  BMI 29.58 kg/m2  SpO2 98% , BMI Body mass index is 29.58 kg/(m^2). GEN: Well nourished, well developed, in no acute distress  HEENT: normal  Neck: no JVD, carotid bruits, or masses Cardiac: RRR; no murmurs, rubs, or gallops,no edema  Respiratory:  Rhonchi and bronchitic cough GI: soft, nontender, nondistended, + BS MS: no deformity or atrophy  Skin: warm and dry, no rash Neuro:  Strength and sensation are intact Psych: euthymic mood, full  affect   EKG:   07/14/15  SR rate 77 normal     Recent Labs: 06/23/2015: Hemoglobin 15.3; Platelets 281.0 08/26/2015: ALT 22 11/27/2015: BUN 16; Creat 0.85; Potassium 3.9; Sodium 135    Lipid Panel Labs(Brief)          Component Value Date/Time   CHOL 92 08/26/2015 0738   TRIG 110.0 08/26/2015 0738   HDL 33.10* 08/26/2015 0738   CHOLHDL 3 08/26/2015 0738   VLDL 22.0 08/26/2015 0738   LDLCALC 37 08/26/2015 0738   LDLDIRECT 119.0 04/17/2015 1113           Wt Readings from Last 3 Encounters:  04/06/16 96.163 kg (212 lb)  01/08/16 96.344 kg (212 lb 6.4 oz)  09/02/15 94.711 kg (208 lb 12.8 oz)        ASSESSMENT AND PLAN:  1. CAD: LHC 8/16 demonstrated an occluded anterolateral branch of D1 and no significant disease elsewhere. He is currently not having angina. Continue beta blocker, ACE inhibitor, statin, ASA.  New nitro called in   2. Ischemic Cardiomyopathy: EF ~ 40-45% by most recent echo on 11/09/15. No signs or symptoms of CHF. He is NYHA class 1. Continue Coreg 3.125 mg BID. And Lisinopril 10 mg bid.   3. Hyperlipidemia: Continue Atorvastatin 80 mg QD. LCL 9/28 was 37.  Will repeat labs next week and possibly decrease to 40 mg  4. Tobacco abuse: We again discussed the importance of quitting. CXR ordered for cancer screening    Current medicines are reviewed at length with the patient today.  The patient does not have concerns regarding medicines.  The following changes have been made: None  Labs/ tests ordered today include: Lipid/Liver CXR No orders of the defined types were placed in this encounter.    F/U with me 6 months   Charlton HawsPeter Nishan       Diagnoses    Codes Comments  Coronary artery disease involving native coronary artery of native heart without angina pectoris - Primary ICD-9-CM: 414.01 ICD-10-CM: I25.10   COPD bronchitis  ICD-9-CM: IMO0001 ICD-10-CM: IMO0001   Smoking history  ICD-9-CM:  V15.82 ICD-10-CM: Z87.891        Reason for Visit   Coronary Artery Disease   Reason for Visit History    Ordered Medications    Disp Refills Start End  nitroGLYCERIN (NITROSTAT) 0.4 MG SL tablet 25 tablet 3 04/06/2016   Place 1 tablet (0.4 mg total) under the tongue every 5 (five) minutes as needed for chest pain. - Sublingual  Discontinued Medications    Reason for Discontinue  nitroGLYCERIN (NITROSTAT) 0.4 MG SL tablet Reorder  Level of Service   Level of Service  PR OFFICE OUTPATIENT VISIT 25 MINUTES [99214]   Follow-up and Disposition   Follow-up and Disposition History    All Charges for This Encounter   Code Description Service Date Service Provider Modifiers Qty  [phone] PR OFFICE OUTPATIENT VISIT 25 MINUTES 04/06/2016 Wendall StadePeter C Nishan, MD  1  AVS Reports   Date/Time Report Action User  04/06/2016 4:08 PM After Visit Summary Printed Ethelda ChickPamela Pate Ingalls, RN  Patient Instructions   Medication Instructions:  Your physician recommends that you continue on your current medications as directed. Please refer to the Current Medication list given to you today.  Labwork: Your physician recommends that you return for lab work next week for fasting liver and lipid.  Testing/Procedures: A chest x-ray takes a picture of the organs and structures inside the chest, including the heart, lungs, and blood vessels. This test can show several things, including, whether the heart is enlarges; whether fluid is building up in the lungs; and whether pacemaker / defibrillator leads are still in place.  Follow-Up: Your physician wants you to follow-up in: 6 months with Dr. Eden EmmsNishan. You will receive a reminder letter in the mail two months in advance. If you don't receive a letter, please call our office to schedule the follow-up appointment.  If you need a refill on your cardiac medications before your next appointment, please call your pharmacy.

## 2016-11-02 ENCOUNTER — Encounter: Payer: Self-pay | Admitting: Cardiovascular Disease
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
## 2016-11-07 ENCOUNTER — Encounter: Payer: Self-pay | Admitting: Cardiovascular Disease

## 2016-11-07 ENCOUNTER — Ambulatory Visit (INDEPENDENT_AMBULATORY_CARE_PROVIDER_SITE_OTHER): Payer: Self-pay | Admitting: Cardiovascular Disease

## 2016-11-07 ENCOUNTER — Encounter (INDEPENDENT_AMBULATORY_CARE_PROVIDER_SITE_OTHER): Payer: Self-pay

## 2016-11-07 VITALS — BP 158/102 | HR 75 | Ht 71.0 in | Wt 214.4 lb

## 2016-11-07 DIAGNOSIS — I2510 Atherosclerotic heart disease of native coronary artery without angina pectoris: Secondary | ICD-10-CM

## 2016-11-07 NOTE — Patient Instructions (Addendum)
Medication Instructions:  Your physician recommends that you continue on your current medications as directed. Please refer to the Current Medication list given to you today.  Labwork: Your physician recommends that you have lab work tomorrow. Lipid and liver panel.  Testing/Procedures: NONE  Follow-Up: Your physician wants you to follow-up in: 6 months with Dr. Eden EmmsNishan. You will receive a reminder letter in the mail two months in advance. If you don't receive a letter, please call our office to schedule the follow-up appointment.   If you need a refill on your cardiac medications before your next appointment, please call your pharmacy.

## 2016-11-08 ENCOUNTER — Other Ambulatory Visit: Payer: Self-pay | Admitting: *Deleted

## 2016-11-08 DIAGNOSIS — Z79899 Other long term (current) drug therapy: Secondary | ICD-10-CM

## 2016-11-08 LAB — HEPATIC FUNCTION PANEL
ALT: 25 U/L (ref 9–46)
AST: 21 U/L (ref 10–40)
Albumin: 4.4 g/dL (ref 3.6–5.1)
Alkaline Phosphatase: 68 U/L (ref 40–115)
BILIRUBIN DIRECT: 0.2 mg/dL (ref <=0.2)
BILIRUBIN INDIRECT: 0.6 mg/dL (ref 0.2–1.2)
BILIRUBIN TOTAL: 0.8 mg/dL (ref 0.2–1.2)
Total Protein: 6.6 g/dL (ref 6.1–8.1)

## 2016-11-08 LAB — LIPID PANEL
CHOL/HDL RATIO: 3.5 ratio (ref <5.0)
CHOLESTEROL: 131 mg/dL (ref <200)
HDL: 37 mg/dL — ABNORMAL LOW (ref >40)
LDL Cholesterol: 70 mg/dL (ref <100)
Triglycerides: 119 mg/dL (ref <150)
VLDL: 24 mg/dL (ref <30)

## 2016-12-12 IMAGING — NM NM MISC PROCEDURE
6 series · 36 of 36 positions shown · non-contrast
Comparison: none

[Series 1: stress-sum-em · 6.40mm/px · 6 of 64 frames shown]
[frame 6/64]
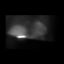
[frame 16/64]
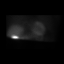
[frame 27/64]
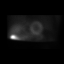
[frame 38/64]
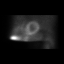
[frame 48/64]
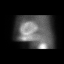
[frame 59/64]
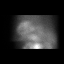

[Series 1: wbr_s-card_st stress-gsp · 6.4mm · 6.40mm/px · 6 of 189 frames shown]
[frame 16/189]
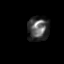
[frame 48/189]
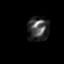
[frame 79/189]
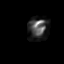
[frame 111/189]
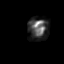
[frame 142/189]
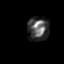
[frame 174/189]
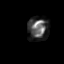

[Series 1: stress-gsp · 6.40mm/px · 6 of 512 frames shown]
[frame 43/512]
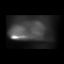
[frame 128/512]
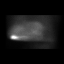
[frame 214/512]
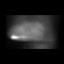
[frame 299/512]
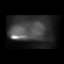
[frame 384/512]
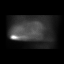
[frame 470/512]
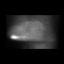

[Series 1: wbr_r-card_st rest · 6.4mm · 6.40mm/px · 6 of 24 frames shown]
[frame 3/24]
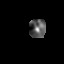
[frame 7/24]
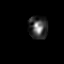
[frame 11/24]
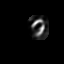
[frame 15/24]
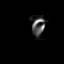
[frame 19/24]
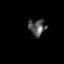
[frame 23/24]
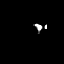

[Series 1: rest · 6.40mm/px · 6 of 64 frames shown]
[frame 6/64]
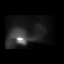
[frame 16/64]
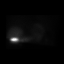
[frame 27/64]
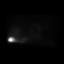
[frame 38/64]
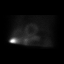
[frame 48/64]
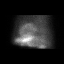
[frame 59/64]
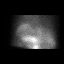

[Series 1: wbr_s-card_st stress-sum-em · 6.4mm · 6.40mm/px · 6 of 25 frames shown]
[frame 3/25]
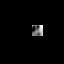
[frame 7/25]
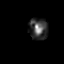
[frame 11/25]
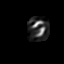
[frame 15/25]
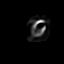
[frame 19/25]
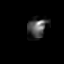
[frame 23/25]
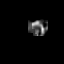

[36 of 36 positions shown; findings below may reference images not displayed]

Canned report from images found in remote index.

Refer to host system for actual result text.

## 2016-12-16 ENCOUNTER — Other Ambulatory Visit: Payer: Self-pay | Admitting: Physician Assistant

## 2016-12-16 DIAGNOSIS — I2510 Atherosclerotic heart disease of native coronary artery without angina pectoris: Secondary | ICD-10-CM

## 2016-12-16 DIAGNOSIS — I255 Ischemic cardiomyopathy: Secondary | ICD-10-CM

## 2017-06-08 ENCOUNTER — Other Ambulatory Visit: Payer: Self-pay | Admitting: Cardiovascular Disease

## 2017-06-08 DIAGNOSIS — I255 Ischemic cardiomyopathy: Secondary | ICD-10-CM
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
## 2017-07-05 ENCOUNTER — Encounter: Payer: Self-pay | Admitting: Cardiovascular Disease

## 2017-07-05 ENCOUNTER — Encounter (INDEPENDENT_AMBULATORY_CARE_PROVIDER_SITE_OTHER): Payer: Self-pay

## 2017-07-05 ENCOUNTER — Ambulatory Visit
Admission: RE | Admit: 2017-07-05 | Discharge: 2017-07-05 | Disposition: A | Discharge status: Home / Self Care | Payer: Self-pay | Source: Ambulatory Visit | Attending: Cardiovascular Disease | Admitting: Cardiovascular Disease

## 2017-07-05 ENCOUNTER — Ambulatory Visit (INDEPENDENT_AMBULATORY_CARE_PROVIDER_SITE_OTHER): Payer: Self-pay | Admitting: Cardiovascular Disease

## 2017-07-05 VITALS — BP 136/80 | HR 64 | Ht 71.0 in | Wt 208.2 lb

## 2017-07-05 DIAGNOSIS — J449 Chronic obstructive pulmonary disease, unspecified: Secondary | ICD-10-CM

## 2017-07-05 DIAGNOSIS — R079 Chest pain, unspecified: Secondary | ICD-10-CM

## 2017-07-05 LAB — LIPID PANEL
CHOL/HDL RATIO: 3.5 ratio (ref 0.0–5.0)
Cholesterol, Total: 131 mg/dL (ref 100–199)
HDL: 37 mg/dL — AB (ref >39)
LDL Calculated: 80 mg/dL (ref 0–99)
Triglycerides: 71 mg/dL (ref 0–149)
VLDL Cholesterol Cal: 14 mg/dL (ref 5–40)

## 2017-07-05 LAB — HEPATIC FUNCTION PANEL
ALBUMIN: 4.1 g/dL (ref 3.5–5.5)
ALT: 33 IU/L (ref 0–44)
AST: 27 IU/L (ref 0–40)
Alkaline Phosphatase: 85 IU/L (ref 39–117)
BILIRUBIN TOTAL: 0.6 mg/dL (ref 0.0–1.2)
BILIRUBIN, DIRECT: 0.15 mg/dL (ref 0.00–0.40)
TOTAL PROTEIN: 6.4 g/dL (ref 6.0–8.5)

## 2017-07-05 NOTE — Patient Instructions (Signed)
Medication Instructions:  Your physician recommends that you continue on your current medications as directed. Please refer to the Current Medication list given to you today.   Labwork: LIPIDS, LFTs  Testing/Procedures: A chest x-ray takes a picture of the organs and structures inside the chest, including the heart, lungs, and blood vessels. This test can show several things, including, whether the heart is enlarges; whether fluid is building up in the lungs; and whether pacemaker / defibrillator leads are still in place.  Your physician has requested that you have an exercise tolerance test. For further information please visit [email or web address] Please also follow instruction sheet, as given.    Follow-Up: Your physician wants you to follow-up in: 1 year with Dr. Eden EmmsNishan. You will receive a reminder letter in the mail two months in advance. If you don't receive a letter, please call our office to schedule the follow-up appointment.   Any Other Special Instructions Will Be Listed Below (If Applicable).     If you need a refill on your cardiac medications before your next appointment, please call your pharmacy.

## 2017-07-06 ENCOUNTER — Telehealth: Payer: Self-pay | Admitting: Cardiovascular Disease

## 2017-07-06 NOTE — Telephone Encounter (Signed)
-----   Message from Wendall StadePeter C Nishan, MD sent at 07/05/2017 10:55 AM EDT ----- CXR ok no cancer

## 2017-07-06 NOTE — Telephone Encounter (Signed)
New Message ° °Pt call requesting to speak with RN about lab results. Please call back to discuss  °

## 2017-07-06 NOTE — Telephone Encounter (Signed)
Patient made aware of results. Patient verbalizes understanding.  

## 2017-07-14 ENCOUNTER — Ambulatory Visit (INDEPENDENT_AMBULATORY_CARE_PROVIDER_SITE_OTHER): Payer: Self-pay

## 2017-07-14 DIAGNOSIS — R079 Chest pain, unspecified: Secondary | ICD-10-CM

## 2017-07-14 LAB — EXERCISE TOLERANCE TEST
CHL CUP MPHR: 175 {beats}/min
CSEPED: 12 min
CSEPEDS: 0 s
CSEPHR: 89 %
Estimated workload: 13.7 METS
Peak HR: 157 {beats}/min
RPE: 16
Rest HR: 67 {beats}/min

## 2017-08-09 ENCOUNTER — Other Ambulatory Visit: Payer: Self-pay | Admitting: Cardiovascular Disease

## 2017-08-09 DIAGNOSIS — E785 Hyperlipidemia, unspecified: Secondary | ICD-10-CM

## 2017-10-06 ENCOUNTER — Other Ambulatory Visit: Payer: Self-pay | Admitting: Cardiovascular Disease

## 2017-12-22 ENCOUNTER — Telehealth: Payer: Self-pay

## 2017-12-22 DIAGNOSIS — E785 Hyperlipidemia, unspecified: Secondary | ICD-10-CM

## 2017-12-22 DIAGNOSIS — Z79899 Other long term (current) drug therapy: Secondary | ICD-10-CM

## 2017-12-22 NOTE — Telephone Encounter (Signed)
Left message for patient to call back. Patient needs to schedule fasting lab work, orders are in.

## 2017-12-22 NOTE — Telephone Encounter (Signed)
-----   Message from Ethelda ChickPamela Pate Ingalls, RN sent at 07/18/2017  2:49 PM EDT ----- Patient needs lipid and liver panel in February

## 2018-01-11 ENCOUNTER — Other Ambulatory Visit: Payer: Self-pay | Admitting: Cardiovascular Disease

## 2018-01-11 DIAGNOSIS — I255 Ischemic cardiomyopathy: Secondary | ICD-10-CM

## 2018-02-13 ENCOUNTER — Other Ambulatory Visit: Payer: Self-pay | Admitting: Cardiovascular Disease

## 2018-02-13 DIAGNOSIS — I255 Ischemic cardiomyopathy: Secondary | ICD-10-CM

## 2018-02-15 ENCOUNTER — Other Ambulatory Visit: Payer: Self-pay | Admitting: Cardiovascular Disease

## 2018-02-15 DIAGNOSIS — I255 Ischemic cardiomyopathy: Secondary | ICD-10-CM

## 2018-02-15 MED ORDER — CARVEDILOL 3.125 MG PO TABS: 3.1250 mg | ORAL_TABLET | Freq: Two times a day (BID) | ORAL | 1 refills | Status: DC
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
## 2018-07-17 ENCOUNTER — Ambulatory Visit (INDEPENDENT_AMBULATORY_CARE_PROVIDER_SITE_OTHER): Payer: Self-pay | Admitting: Cardiovascular Disease

## 2018-07-17 ENCOUNTER — Encounter (INDEPENDENT_AMBULATORY_CARE_PROVIDER_SITE_OTHER): Payer: Self-pay

## 2018-07-17 ENCOUNTER — Encounter: Payer: Self-pay | Admitting: Cardiovascular Disease

## 2018-07-17 VITALS — BP 150/94 | HR 69 | Ht 71.0 in | Wt 210.0 lb

## 2018-07-17 DIAGNOSIS — R079 Chest pain, unspecified: Secondary | ICD-10-CM

## 2018-07-17 DIAGNOSIS — I10 Essential (primary) hypertension: Secondary | ICD-10-CM

## 2018-07-17 NOTE — Patient Instructions (Signed)
Medication Instructions:  Your physician recommends that you continue on your current medications as directed. Please refer to the Current Medication list given to you today.  Labwork: Your physician recommends that you return for lab work in: schedule when come in for stress test, BMET, CBC, LFTs, lipids- Please be fasting for these labs.  Testing/Procedures: Your physician has requested that you have an exercise tolerance test. For further information please visit [email or web address] Please also follow instruction sheet, as given.  Follow-Up: Your physician wants you to follow-up in: 6 months with Dr. Eden EmmsNishan. You will receive a reminder letter in the mail two months in advance. If you don't receive a letter, please call our office to schedule the follow-up appointment.   If you need a refill on your cardiac medications before your next appointment, please call your pharmacy.

## 2018-07-24 ENCOUNTER — Ambulatory Visit (INDEPENDENT_AMBULATORY_CARE_PROVIDER_SITE_OTHER): Payer: Self-pay

## 2018-07-24 ENCOUNTER — Other Ambulatory Visit: Payer: Self-pay

## 2018-07-24 DIAGNOSIS — R079 Chest pain, unspecified: Secondary | ICD-10-CM

## 2018-07-24 DIAGNOSIS — I10 Essential (primary) hypertension: Secondary | ICD-10-CM

## 2018-07-24 LAB — CBC WITH DIFFERENTIAL/PLATELET
Basophils Absolute: 0.1 10*3/uL (ref 0.0–0.2)
Basos: 1 %
EOS (ABSOLUTE): 0.3 10*3/uL (ref 0.0–0.4)
Eos: 4 %
Hematocrit: 43 % (ref 37.5–51.0)
Hemoglobin: 14.9 g/dL (ref 13.0–17.7)
IMMATURE GRANULOCYTES: 0 %
Immature Grans (Abs): 0 10*3/uL (ref 0.0–0.1)
LYMPHS: 30 %
Lymphocytes Absolute: 2.8 10*3/uL (ref 0.7–3.1)
MCH: 30.6 pg (ref 26.6–33.0)
MCHC: 34.7 g/dL (ref 31.5–35.7)
MCV: 88 fL (ref 79–97)
MONOS ABS: 0.8 10*3/uL (ref 0.1–0.9)
Monocytes: 9 %
NEUTROS PCT: 56 %
Neutrophils Absolute: 5.2 10*3/uL (ref 1.4–7.0)
PLATELETS: 264 10*3/uL (ref 150–450)
RBC: 4.87 x10E6/uL (ref 4.14–5.80)
RDW: 13.8 % (ref 12.3–15.4)
WBC: 9.3 10*3/uL (ref 3.4–10.8)

## 2018-07-24 LAB — BASIC METABOLIC PANEL
BUN/Creatinine Ratio: 12 (ref 9–20)
BUN: 10 mg/dL (ref 6–24)
CO2: 25 mmol/L (ref 20–29)
Calcium: 9.6 mg/dL (ref 8.7–10.2)
Chloride: 101 mmol/L (ref 96–106)
Creatinine, Ser: 0.82 mg/dL (ref 0.76–1.27)
GFR calc Af Amer: 123 mL/min/{1.73_m2} (ref >59)
GFR calc non Af Amer: 106 mL/min/{1.73_m2} (ref >59)
GLUCOSE: 91 mg/dL (ref 65–99)
POTASSIUM: 5.6 mmol/L — AB (ref 3.5–5.2)
SODIUM: 138 mmol/L (ref 134–144)

## 2018-07-24 LAB — HEPATIC FUNCTION PANEL
ALK PHOS: 76 IU/L (ref 39–117)
ALT: 20 IU/L (ref 0–44)
AST: 14 IU/L (ref 0–40)
Albumin: 4.2 g/dL (ref 3.5–5.5)
BILIRUBIN, DIRECT: 0.17 mg/dL (ref 0.00–0.40)
Bilirubin Total: 0.6 mg/dL (ref 0.0–1.2)
TOTAL PROTEIN: 6.6 g/dL (ref 6.0–8.5)

## 2018-07-24 LAB — EXERCISE TOLERANCE TEST
CHL CUP RESTING HR STRESS: 58 {beats}/min
CSEPEDS: 8 s
CSEPEW: 11.9 METS
CSEPPHR: 136 {beats}/min
Exercise duration (min): 10 min
MPHR: 174 {beats}/min
Percent HR: 78 %
RPE: 15

## 2018-07-24 LAB — LIPID PANEL
CHOLESTEROL TOTAL: 141 mg/dL (ref 100–199)
Chol/HDL Ratio: 3.7 ratio (ref 0.0–5.0)
HDL: 38 mg/dL — AB (ref >39)
LDL Calculated: 75 mg/dL (ref 0–99)
TRIGLYCERIDES: 138 mg/dL (ref 0–149)
VLDL Cholesterol Cal: 28 mg/dL (ref 5–40)

## 2018-07-27 ENCOUNTER — Telehealth: Payer: Self-pay | Admitting: Cardiovascular Disease

## 2018-07-27 ENCOUNTER — Telehealth: Payer: Self-pay | Admitting: *Deleted

## 2018-07-27 DIAGNOSIS — E875 Hyperkalemia: Secondary | ICD-10-CM

## 2018-07-27 NOTE — Telephone Encounter (Signed)
Spoke with pt and went over results and recommendations per Dr. Eden EmmsNishan.  Educated pt on high K+ foods and asked him to decrease those. Pt will come for labs on 9/26.  Pt verbalized understanding and was in agreement with this plan.

## 2018-07-27 NOTE — Telephone Encounter (Signed)
-----   Message from Wendall StadePeter C Nishan, MD sent at 07/24/2018  4:47 PM EDT ----- K a bit high but renal function normal repeat in 4 weeks if K still high may have to stop aldactone  Cholesterol and liver good

## 2018-07-27 NOTE — Telephone Encounter (Signed)
Informed pt of results. Pt verbalized understanding. 

## 2018-07-27 NOTE — Telephone Encounter (Signed)
New message  Patient is returning call for ETT results.

## 2018-08-21 ENCOUNTER — Other Ambulatory Visit: Payer: Self-pay | Admitting: Cardiovascular Disease

## 2018-08-21 DIAGNOSIS — I255 Ischemic cardiomyopathy: Secondary | ICD-10-CM

## 2018-08-23 ENCOUNTER — Other Ambulatory Visit: Payer: Self-pay | Admitting: *Deleted

## 2018-08-23 ENCOUNTER — Encounter (INDEPENDENT_AMBULATORY_CARE_PROVIDER_SITE_OTHER): Payer: Self-pay

## 2018-08-23 DIAGNOSIS — E875 Hyperkalemia: Secondary | ICD-10-CM

## 2018-08-24 ENCOUNTER — Telehealth: Payer: Self-pay | Admitting: Cardiovascular Disease

## 2018-08-24 LAB — BASIC METABOLIC PANEL
BUN / CREAT RATIO: 17 (ref 9–20)
BUN: 16 mg/dL (ref 6–24)
CO2: 25 mmol/L (ref 20–29)
Calcium: 9.5 mg/dL (ref 8.7–10.2)
Chloride: 103 mmol/L (ref 96–106)
Creatinine, Ser: 0.92 mg/dL (ref 0.76–1.27)
GFR calc non Af Amer: 99 mL/min/{1.73_m2} (ref >59)
GFR, EST AFRICAN AMERICAN: 115 mL/min/{1.73_m2} (ref >59)
GLUCOSE: 71 mg/dL (ref 65–99)
Potassium: 4.4 mmol/L (ref 3.5–5.2)
SODIUM: 140 mmol/L (ref 134–144)

## 2018-08-24 NOTE — Telephone Encounter (Signed)
Called patient back with lab results. 

## 2018-08-24 NOTE — Telephone Encounter (Signed)
New Message: ° ° ° °Patient is calling for lab results  °

## 2018-10-04 ENCOUNTER — Other Ambulatory Visit: Payer: Self-pay | Admitting: Cardiovascular Disease

## 2018-10-04 DIAGNOSIS — E785 Hyperlipidemia, unspecified: Secondary | ICD-10-CM

## 2018-10-29 ENCOUNTER — Other Ambulatory Visit: Payer: Self-pay | Admitting: Cardiovascular Disease

## 2019-01-02 ENCOUNTER — Other Ambulatory Visit: Payer: Self-pay | Admitting: Cardiovascular Disease

## 2019-01-02 DIAGNOSIS — I255 Ischemic cardiomyopathy: Secondary | ICD-10-CM
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
## 2019-01-18 ENCOUNTER — Ambulatory Visit: Payer: Self-pay | Admitting: Cardiovascular Disease
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
## 2019-01-30 ENCOUNTER — Encounter: Payer: Self-pay | Admitting: Cardiovascular Disease

## 2019-01-30 ENCOUNTER — Ambulatory Visit (INDEPENDENT_AMBULATORY_CARE_PROVIDER_SITE_OTHER): Payer: Self-pay | Admitting: Cardiovascular Disease

## 2019-01-30 VITALS — BP 160/92 | HR 63 | Ht 71.0 in | Wt 211.1 lb

## 2019-01-30 DIAGNOSIS — I255 Ischemic cardiomyopathy: Secondary | ICD-10-CM

## 2019-01-30 DIAGNOSIS — I2510 Atherosclerotic heart disease of native coronary artery without angina pectoris: Secondary | ICD-10-CM

## 2019-01-30 DIAGNOSIS — I10 Essential (primary) hypertension: Secondary | ICD-10-CM

## 2019-01-30 DIAGNOSIS — Z79899 Other long term (current) drug therapy: Secondary | ICD-10-CM

## 2019-01-30 DIAGNOSIS — E785 Hyperlipidemia, unspecified: Secondary | ICD-10-CM

## 2019-01-30 MED ORDER — NITROGLYCERIN 0.4 MG SL SUBL: 0.4000 mg | SUBLINGUAL_TABLET | SUBLINGUAL | 3 refills | Status: DC | PRN

## 2019-01-30 MED ORDER — SPIRONOLACTONE 25 MG PO TABS: 12.5000 mg | ORAL_TABLET | Freq: Every day | ORAL | 3 refills | Status: DC

## 2019-01-30 NOTE — Patient Instructions (Addendum)
Medication Instructions:  Your physician has recommended you make the following change in your medication:  1-START Spironolactone 12.5 mg by mouth daily.  If you need a refill on your cardiac medications before your next appointment, please call your pharmacy.   Lab work: Your physician recommends that you have lab work in 3 weeks. BMET  If you have labs (blood work) drawn today and your tests are completely normal, you will receive your results only by: Marland Kitchen MyChart Message (if you have MyChart) OR . A paper copy in the mail If you have any lab test that is abnormal or we need to change your treatment, we will call you to review the results.  Testing/Procedures: None ordered today.  Follow-Up: At [facility], you and your health needs are our priority.  As part of our continuing mission to provide you with exceptional heart care, we have created designated Provider Care Teams.  These Care Teams include your primary Cardiologist (physician) and Advanced Practice Providers (APPs -  Physician Assistants and Nurse Practitioners) who all work together to provide you with the care you need, when you need it. You will need a follow up appointment in 12 months.  Please call our office 2 months in advance to schedule this appointment.  You may see Charlton Haws, MD or one of the following Advanced Practice Providers on your designated Care Team:   Norma Fredrickson, NP Nada Boozer, NP . Georgie Chard, NP

## 2019-02-20 ENCOUNTER — Other Ambulatory Visit: Payer: Self-pay

## 2019-03-13 ENCOUNTER — Other Ambulatory Visit: Payer: Self-pay | Admitting: Cardiovascular Disease

## 2019-03-13 DIAGNOSIS — I255 Ischemic cardiomyopathy: Secondary | ICD-10-CM

## 2019-04-24 ENCOUNTER — Other Ambulatory Visit: Payer: Self-pay

## 2019-09-10 ENCOUNTER — Emergency Department (HOSPITAL_COMMUNITY): Payer: Self-pay

## 2019-09-10 ENCOUNTER — Other Ambulatory Visit: Payer: Self-pay

## 2019-09-10 ENCOUNTER — Emergency Department (HOSPITAL_COMMUNITY)
Admission: EM | Admit: 2019-09-10 | Discharge: 2019-09-10 | Disposition: A | Discharge status: Home / Self Care | Payer: Self-pay | Attending: Emergency Medicine | Admitting: Emergency Medicine

## 2019-09-10 DIAGNOSIS — S39012A Strain of muscle, fascia and tendon of lower back, initial encounter: Secondary | ICD-10-CM | POA: Insufficient documentation

## 2019-09-10 DIAGNOSIS — Y939 Activity, unspecified: Secondary | ICD-10-CM | POA: Insufficient documentation

## 2019-09-10 DIAGNOSIS — Z79899 Other long term (current) drug therapy: Secondary | ICD-10-CM | POA: Insufficient documentation

## 2019-09-10 DIAGNOSIS — X58XXXA Exposure to other specified factors, initial encounter: Secondary | ICD-10-CM | POA: Insufficient documentation

## 2019-09-10 DIAGNOSIS — I2510 Atherosclerotic heart disease of native coronary artery without angina pectoris: Secondary | ICD-10-CM | POA: Insufficient documentation

## 2019-09-10 DIAGNOSIS — Y929 Unspecified place or not applicable: Secondary | ICD-10-CM | POA: Insufficient documentation

## 2019-09-10 DIAGNOSIS — F17210 Nicotine dependence, cigarettes, uncomplicated: Secondary | ICD-10-CM | POA: Insufficient documentation

## 2019-09-10 DIAGNOSIS — Z7982 Long term (current) use of aspirin: Secondary | ICD-10-CM | POA: Insufficient documentation

## 2019-09-10 DIAGNOSIS — Y999 Unspecified external cause status: Secondary | ICD-10-CM | POA: Insufficient documentation

## 2019-09-10 MED ORDER — IBUPROFEN 600 MG PO TABS: 600.0000 mg | ORAL_TABLET | Freq: Three times a day (TID) | ORAL | 0 refills | Status: DC | PRN

## 2019-09-10 MED ORDER — METHOCARBAMOL 750 MG PO TABS: 750.0000 mg | ORAL_TABLET | Freq: Three times a day (TID) | ORAL | 0 refills | Status: DC | PRN

## 2019-09-10 MED ORDER — IBUPROFEN 400 MG PO TABS
600.0000 mg | ORAL_TABLET | Freq: Once | ORAL | Status: AC
  Administered 2019-09-10: 600 mg via ORAL
  Filled 2019-09-10: qty 1

## 2019-09-10 NOTE — Discharge Instructions (Addendum)
It was our pleasure to provide your ER care today - we hope that you feel better.  Take ibuprofen as need for pain, and robaxin as need for muscle pain/spasm. Robaxin may cause drowsiness, no driving when taking.   Try heat to sore area.  Avoid heavy lifting > 20 lbs or bending at waist for the next 3 days.   Follow up with primary care doctor in the next 1-2 weeks if symptoms fail to improve/resolve.  Return to ER if worse, new symptoms, fevers, worsening or severe pain, numbness/weakness, abdominal pain, vomiting, or other concern.
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
## 2019-09-10 NOTE — ED Triage Notes (Signed)
Pt reports right mid lower back pain that began Thursday. Denies recent injury, fever, dysuria. Pt ambulatory NAD at present. Tried tylenol at home for pain PTA. VSS.

## 2019-12-18 ENCOUNTER — Telehealth: Payer: Self-pay | Admitting: Cardiovascular Disease

## 2019-12-18 NOTE — Telephone Encounter (Signed)
We are recommending the COVID-19 vaccine to all of our patients. Cardiac medications (including blood thinners) should not deter anyone from being vaccinated and there is no need to hold any of those medications prior to vaccine administration.     Currently, there is a hotline to call (active 12/06/19) to schedule vaccination appointments as no walk-ins will be accepted.   Number: [phone].    If an appointment is not available please go to [email or web address] to sign up for notification when additional vaccine appointments are available.   If you have further questions or concerns about the vaccine process, please visit [email or web address] or contact your primary care physician.   

## 2019-12-25 ENCOUNTER — Other Ambulatory Visit: Payer: Self-pay | Admitting: Cardiovascular Disease

## 2019-12-25 DIAGNOSIS — E785 Hyperlipidemia, unspecified: Secondary | ICD-10-CM

## 2020-01-30 ENCOUNTER — Other Ambulatory Visit: Payer: Self-pay | Admitting: *Deleted

## 2020-01-30 ENCOUNTER — Other Ambulatory Visit: Payer: Self-pay

## 2020-01-30 DIAGNOSIS — E785 Hyperlipidemia, unspecified: Secondary | ICD-10-CM

## 2020-01-30 DIAGNOSIS — I2510 Atherosclerotic heart disease of native coronary artery without angina pectoris: Secondary | ICD-10-CM

## 2020-01-30 DIAGNOSIS — Z79899 Other long term (current) drug therapy: Secondary | ICD-10-CM

## 2020-01-30 DIAGNOSIS — I255 Ischemic cardiomyopathy: Secondary | ICD-10-CM

## 2020-01-30 DIAGNOSIS — I10 Essential (primary) hypertension: Secondary | ICD-10-CM

## 2020-01-30 LAB — BASIC METABOLIC PANEL
BUN/Creatinine Ratio: 17 (ref 9–20)
BUN: 16 mg/dL (ref 6–24)
CO2: 21 mmol/L (ref 20–29)
Calcium: 9.8 mg/dL (ref 8.7–10.2)
Chloride: 100 mmol/L (ref 96–106)
Creatinine, Ser: 0.95 mg/dL (ref 0.76–1.27)
GFR calc Af Amer: 109 mL/min/{1.73_m2} (ref >59)
GFR calc non Af Amer: 94 mL/min/{1.73_m2} (ref >59)
Glucose: 92 mg/dL (ref 65–99)
Potassium: 4.7 mmol/L (ref 3.5–5.2)
Sodium: 136 mmol/L (ref 134–144)
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
## 2020-02-19 ENCOUNTER — Ambulatory Visit
Admission: RE | Admit: 2020-02-19 | Discharge: 2020-02-19 | Disposition: A | Discharge status: Home / Self Care | Payer: Self-pay | Source: Ambulatory Visit | Attending: Cardiovascular Disease | Admitting: Cardiovascular Disease

## 2020-02-19 ENCOUNTER — Other Ambulatory Visit: Payer: Self-pay

## 2020-02-19 ENCOUNTER — Encounter: Payer: Self-pay | Admitting: Cardiovascular Disease

## 2020-02-19 ENCOUNTER — Ambulatory Visit (INDEPENDENT_AMBULATORY_CARE_PROVIDER_SITE_OTHER): Payer: Self-pay | Admitting: Cardiovascular Disease

## 2020-02-19 VITALS — BP 170/110 | HR 63 | Ht 71.0 in | Wt 224.0 lb

## 2020-02-19 DIAGNOSIS — I2510 Atherosclerotic heart disease of native coronary artery without angina pectoris: Secondary | ICD-10-CM

## 2020-02-19 DIAGNOSIS — I255 Ischemic cardiomyopathy: Secondary | ICD-10-CM

## 2020-02-19 DIAGNOSIS — I2583 Coronary atherosclerosis due to lipid rich plaque: Secondary | ICD-10-CM

## 2020-02-19 MED ORDER — ALBUTEROL SULFATE HFA 108 (90 BASE) MCG/ACT IN AERS: 2.0000 | INHALATION_SPRAY | Freq: Four times a day (QID) | RESPIRATORY_TRACT | 3 refills | Status: DC | PRN

## 2020-02-19 NOTE — Patient Instructions (Addendum)
Medication Instructions:  none *If you need a refill on your cardiac medications before your next appointment, please call your pharmacy*   Lab Work: none If you have labs (blood work) drawn today and your tests are completely normal, you will receive your results only by: Marland Kitchen MyChart Message (if you have MyChart) OR . A paper copy in the mail If you have any lab test that is abnormal or we need to change your treatment, we will call you to review the results.   Testing/Procedures:  TODAY:  Chest X-Ray                                       [address]   Please schedule Your physician has requested that you have an echocardiogram. Echocardiography is a painless test that uses sound waves to create images of your heart. It provides your doctor with information about the size and shape of your heart and how well your heart's chambers and valves are working. This procedure takes approximately one hour. There are no restrictions for this procedure.     Follow-Up: At [facility], you and your health needs are our priority.  As part of our continuing mission to provide you with exceptional heart care, we have created designated Provider Care Teams.  These Care Teams include your primary Cardiologist (physician) and Advanced Practice Providers (APPs -  Physician Assistants and Nurse Practitioners) who all work together to provide you with the care you need, when you need it.  We recommend signing up for the patient portal called "MyChart".  Sign up information is provided on this After Visit Summary.  MyChart is used to connect with patients for Virtual Visits (Telemedicine).  Patients are able to view lab/test results, encounter notes, upcoming appointments, etc.  Non-urgent messages can be sent to your provider as well.   To learn more about what you can do with MyChart, go to [email or web address].    Your next appointment:   1 year(s)  The format for your next  appointment:   Either In Person or Virtual  Provider:   Dr Eden Emms   Other Instructions

## 2020-02-20 ENCOUNTER — Telehealth: Payer: Self-pay | Admitting: Cardiovascular Disease

## 2020-02-20 DIAGNOSIS — I2510 Atherosclerotic heart disease of native coronary artery without angina pectoris: Secondary | ICD-10-CM

## 2020-02-20 DIAGNOSIS — Z720 Tobacco use: Secondary | ICD-10-CM

## 2020-02-20 DIAGNOSIS — I2583 Coronary atherosclerosis due to lipid rich plaque: Secondary | ICD-10-CM

## 2020-02-20 NOTE — Telephone Encounter (Signed)
New message  Patient states that he was contacted to have the X-ray done over again by Dr. Fabio Bering nurse. Patient is returning call. Please give patient a call back.

## 2020-02-20 NOTE — Telephone Encounter (Signed)
Daoud called returning Lacretia Leigh call. I reached out to [facility] through secure chat and then transferred the call over to her.

## 2020-02-20 NOTE — Telephone Encounter (Signed)
Left message for patient to call back  

## 2020-02-20 NOTE — Telephone Encounter (Signed)
Patient called back. Informed him of his chest xray, and that he would need a repeat study with nipple marks. Patient verbalized understanding and will get repeat test done tomorrow.

## 2020-02-28 ENCOUNTER — Ambulatory Visit
Admission: RE | Admit: 2020-02-28 | Discharge: 2020-02-28 | Disposition: A | Discharge status: Home / Self Care | Payer: Self-pay | Source: Ambulatory Visit | Attending: Cardiovascular Disease | Admitting: Cardiovascular Disease

## 2020-02-28 DIAGNOSIS — I2510 Atherosclerotic heart disease of native coronary artery without angina pectoris: Secondary | ICD-10-CM

## 2020-02-28 DIAGNOSIS — Z720 Tobacco use: Secondary | ICD-10-CM

## 2020-03-06 ENCOUNTER — Other Ambulatory Visit: Payer: Self-pay

## 2020-03-06 ENCOUNTER — Ambulatory Visit (HOSPITAL_COMMUNITY): Payer: Self-pay | Attending: Cardiology

## 2020-03-06 DIAGNOSIS — I2510 Atherosclerotic heart disease of native coronary artery without angina pectoris: Secondary | ICD-10-CM

## 2020-03-06 DIAGNOSIS — I2583 Coronary atherosclerosis due to lipid rich plaque: Secondary | ICD-10-CM

## 2020-03-06 DIAGNOSIS — I255 Ischemic cardiomyopathy: Secondary | ICD-10-CM

## 2020-04-02 ENCOUNTER — Other Ambulatory Visit: Payer: Self-pay | Admitting: Cardiovascular Disease

## 2020-04-02 DIAGNOSIS — I255 Ischemic cardiomyopathy: Secondary | ICD-10-CM

## 2020-05-28 ENCOUNTER — Other Ambulatory Visit: Payer: Self-pay | Admitting: Cardiovascular Disease

## 2020-05-28 DIAGNOSIS — I255 Ischemic cardiomyopathy: Secondary | ICD-10-CM

## 2020-09-10 ENCOUNTER — Other Ambulatory Visit: Payer: Self-pay | Admitting: Cardiovascular Disease

## 2020-09-11 ENCOUNTER — Telehealth: Payer: Self-pay | Admitting: *Deleted

## 2020-09-11 NOTE — Telephone Encounter (Signed)
I called [facility] and provided approval for patients spironolactone as requested via escribe as our escribe system is down at this time.

## 2020-10-01 ENCOUNTER — Other Ambulatory Visit: Payer: Self-pay | Admitting: Cardiovascular Disease

## 2020-10-01 DIAGNOSIS — E785 Hyperlipidemia, unspecified: Secondary | ICD-10-CM
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
## 2021-03-02 ENCOUNTER — Ambulatory Visit (INDEPENDENT_AMBULATORY_CARE_PROVIDER_SITE_OTHER): Payer: Self-pay | Admitting: Cardiovascular Disease

## 2021-03-02 ENCOUNTER — Ambulatory Visit
Admission: RE | Admit: 2021-03-02 | Discharge: 2021-03-02 | Disposition: A | Discharge status: Home / Self Care | Payer: Self-pay | Source: Ambulatory Visit | Attending: Cardiovascular Disease | Admitting: Cardiovascular Disease

## 2021-03-02 ENCOUNTER — Other Ambulatory Visit: Payer: Self-pay

## 2021-03-02 ENCOUNTER — Encounter: Payer: Self-pay | Admitting: Cardiovascular Disease

## 2021-03-02 VITALS — BP 162/100 | HR 58 | Ht 70.0 in | Wt 206.0 lb

## 2021-03-02 DIAGNOSIS — I10 Essential (primary) hypertension: Secondary | ICD-10-CM

## 2021-03-02 DIAGNOSIS — F17200 Nicotine dependence, unspecified, uncomplicated: Secondary | ICD-10-CM

## 2021-03-02 DIAGNOSIS — I2510 Atherosclerotic heart disease of native coronary artery without angina pectoris: Secondary | ICD-10-CM

## 2021-03-02 LAB — COMPREHENSIVE METABOLIC PANEL
ALT: 22 IU/L (ref 0–44)
AST: 28 IU/L (ref 0–40)
Albumin/Globulin Ratio: 1.6 (ref 1.2–2.2)
Albumin: 4.6 g/dL (ref 4.0–5.0)
Alkaline Phosphatase: 97 IU/L (ref 44–121)
BUN/Creatinine Ratio: 16 (ref 9–20)
BUN: 15 mg/dL (ref 6–24)
Bilirubin Total: 0.9 mg/dL (ref 0.0–1.2)
CO2: 23 mmol/L (ref 20–29)
Calcium: 9.7 mg/dL (ref 8.7–10.2)
Chloride: 99 mmol/L (ref 96–106)
Creatinine, Ser: 0.92 mg/dL (ref 0.76–1.27)
Globulin, Total: 2.8 g/dL (ref 1.5–4.5)
Glucose: 96 mg/dL (ref 65–99)
Potassium: 4.7 mmol/L (ref 3.5–5.2)
Sodium: 137 mmol/L (ref 134–144)
Total Protein: 7.4 g/dL (ref 6.0–8.5)
eGFR: 102 mL/min/{1.73_m2} (ref >59)

## 2021-03-02 LAB — CBC WITH DIFFERENTIAL/PLATELET
Basophils Absolute: 0.1 10*3/uL (ref 0.0–0.2)
Basos: 1 %
EOS (ABSOLUTE): 0.2 10*3/uL (ref 0.0–0.4)
Eos: 2 %
Hematocrit: 47.6 % (ref 37.5–51.0)
Hemoglobin: 16.4 g/dL (ref 13.0–17.7)
Immature Grans (Abs): 0 10*3/uL (ref 0.0–0.1)
Immature Granulocytes: 0 %
Lymphocytes Absolute: 3 10*3/uL (ref 0.7–3.1)
Lymphs: 21 %
MCH: 30.4 pg (ref 26.6–33.0)
MCHC: 34.5 g/dL (ref 31.5–35.7)
MCV: 88 fL (ref 79–97)
Monocytes Absolute: 1.3 10*3/uL — ABNORMAL HIGH (ref 0.1–0.9)
Monocytes: 9 %
Neutrophils Absolute: 9.6 10*3/uL — ABNORMAL HIGH (ref 1.4–7.0)
Neutrophils: 67 %
Platelets: 267 10*3/uL (ref 150–450)
RBC: 5.4 x10E6/uL (ref 4.14–5.80)
RDW: 12.8 % (ref 11.6–15.4)
WBC: 14.3 10*3/uL — ABNORMAL HIGH (ref 3.4–10.8)

## 2021-03-02 LAB — LIPID PANEL
Chol/HDL Ratio: 4.5 ratio (ref 0.0–5.0)
Cholesterol, Total: 181 mg/dL (ref 100–199)
HDL: 40 mg/dL (ref >39)
LDL Chol Calc (NIH): 111 mg/dL — ABNORMAL HIGH (ref 0–99)
Triglycerides: 172 mg/dL — ABNORMAL HIGH (ref 0–149)
VLDL Cholesterol Cal: 30 mg/dL (ref 5–40)

## 2021-03-02 MED ORDER — ALBUTEROL SULFATE HFA 108 (90 BASE) MCG/ACT IN AERS: 2.0000 | INHALATION_SPRAY | Freq: Four times a day (QID) | RESPIRATORY_TRACT | 3 refills | Status: AC | PRN

## 2021-03-02 NOTE — Patient Instructions (Addendum)
Medication Instructions:  *If you need a refill on your cardiac medications before your next appointment, please call your pharmacy*  Lab Work: Your physician recommends that you have lab work today- CMET, CBC, Lipids  If you have labs (blood work) drawn today and your tests are completely normal, you will receive your results only by: Marland Kitchen MyChart Message (if you have MyChart) OR . A paper copy in the mail If you have any lab test that is abnormal or we need to change your treatment, we will call you to review the results.  Testing/Procedures: A chest x-ray takes a picture of the organs and structures inside the chest, including the heart, lungs, and blood vessels. This test can show several things, including, whether the heart is enlarges; whether fluid is building up in the lungs; and whether pacemaker / defibrillator leads are still in place.  Follow-Up: At [facility], you and your health needs are our priority.  As part of our continuing mission to provide you with exceptional heart care, we have created designated Provider Care Teams.  These Care Teams include your primary Cardiologist (physician) and Advanced Practice Providers (APPs -  Physician Assistants and Nurse Practitioners) who all work together to provide you with the care you need, when you need it.  We recommend signing up for the patient portal called "MyChart".  Sign up information is provided on this After Visit Summary.  MyChart is used to connect with patients for Virtual Visits (Telemedicine).  Patients are able to view lab/test results, encounter notes, upcoming appointments, etc.  Non-urgent messages can be sent to your provider as well.   To learn more about what you can do with MyChart, go to [email or web address].    Your next appointment:   12 month(s)  The format for your next appointment:   In Person  Provider:   You may see Charlton Haws, MD or one of the following Advanced Practice Providers on your  designated Care Team:    Georgie Chard, NP

## 2021-03-03 ENCOUNTER — Telehealth: Payer: Self-pay

## 2021-03-03 DIAGNOSIS — I10 Essential (primary) hypertension: Secondary | ICD-10-CM

## 2021-03-03 DIAGNOSIS — I2510 Atherosclerotic heart disease of native coronary artery without angina pectoris: Secondary | ICD-10-CM

## 2021-03-03 NOTE — Telephone Encounter (Signed)
Called patient's girlfriend (DPR) with results. Patient has not been taking his lipitor daily, but has recently started back on it. She will have patient come back on 06/09/21 for fasting lab work. Patient does not have a PCP, due to not having any insurance. Will let Dr. Eden Emms know.

## 2021-03-03 NOTE — Telephone Encounter (Signed)
-----   Message from Wendall Stade, MD sent at 03/02/2021  6:19 PM EDT ----- LDL 111 Is he taking his lipitor every day  He was not totally fasting so not worried about Triglycerides Repeat fasting labs in 3 months

## 2021-03-05 ENCOUNTER — Other Ambulatory Visit: Payer: Self-pay

## 2021-03-05 DIAGNOSIS — D72829 Elevated white blood cell count, unspecified: Secondary | ICD-10-CM

## 2021-04-30 ENCOUNTER — Other Ambulatory Visit: Payer: Self-pay

## 2021-04-30 ENCOUNTER — Other Ambulatory Visit: Payer: Self-pay | Admitting: *Deleted

## 2021-04-30 DIAGNOSIS — D72829 Elevated white blood cell count, unspecified: Secondary | ICD-10-CM

## 2021-04-30 LAB — CBC WITH DIFFERENTIAL/PLATELET
Basophils Absolute: 0.1 10*3/uL (ref 0.0–0.2)
Basos: 1 %
EOS (ABSOLUTE): 0.2 10*3/uL (ref 0.0–0.4)
Eos: 2 %
Hematocrit: 46.2 % (ref 37.5–51.0)
Hemoglobin: 15.7 g/dL (ref 13.0–17.7)
Immature Grans (Abs): 0 10*3/uL (ref 0.0–0.1)
Immature Granulocytes: 0 %
Lymphocytes Absolute: 3.2 10*3/uL — ABNORMAL HIGH (ref 0.7–3.1)
Lymphs: 24 %
MCH: 30.7 pg (ref 26.6–33.0)
MCHC: 34 g/dL (ref 31.5–35.7)
MCV: 90 fL (ref 79–97)
Monocytes Absolute: 1.2 10*3/uL — ABNORMAL HIGH (ref 0.1–0.9)
Monocytes: 9 %
Neutrophils Absolute: 8.6 10*3/uL — ABNORMAL HIGH (ref 1.4–7.0)
Neutrophils: 64 %
Platelets: 254 10*3/uL (ref 150–450)
RBC: 5.12 x10E6/uL (ref 4.14–5.80)
RDW: 13.2 % (ref 11.6–15.4)
WBC: 13.4 10*3/uL — ABNORMAL HIGH (ref 3.4–10.8)

## 2021-05-03 ENCOUNTER — Telehealth: Payer: Self-pay | Admitting: Cardiovascular Disease

## 2021-05-03 NOTE — Telephone Encounter (Signed)
Encounter not needed

## 2021-05-20 ENCOUNTER — Other Ambulatory Visit: Payer: Self-pay | Admitting: Cardiovascular Disease

## 2021-05-20 DIAGNOSIS — I255 Ischemic cardiomyopathy: Secondary | ICD-10-CM

## 2021-05-20 DIAGNOSIS — E785 Hyperlipidemia, unspecified: Secondary | ICD-10-CM

## 2021-06-09 ENCOUNTER — Other Ambulatory Visit: Payer: Self-pay

## 2021-06-09 ENCOUNTER — Other Ambulatory Visit: Payer: Self-pay | Admitting: *Deleted

## 2021-06-09 DIAGNOSIS — I10 Essential (primary) hypertension: Secondary | ICD-10-CM

## 2021-06-09 DIAGNOSIS — I2583 Coronary atherosclerosis due to lipid rich plaque: Secondary | ICD-10-CM

## 2021-06-09 DIAGNOSIS — I2510 Atherosclerotic heart disease of native coronary artery without angina pectoris: Secondary | ICD-10-CM

## 2021-06-09 LAB — LIPID PANEL
Chol/HDL Ratio: 2.8 ratio (ref 0.0–5.0)
Cholesterol, Total: 123 mg/dL (ref 100–199)
HDL: 44 mg/dL (ref >39)
LDL Chol Calc (NIH): 63 mg/dL (ref 0–99)
Triglycerides: 81 mg/dL (ref 0–149)
VLDL Cholesterol Cal: 16 mg/dL (ref 5–40)

## 2021-08-24 ENCOUNTER — Other Ambulatory Visit: Payer: Self-pay | Admitting: Cardiovascular Disease

## 2021-08-24 DIAGNOSIS — I255 Ischemic cardiomyopathy: Secondary | ICD-10-CM

## 2022-03-24 ENCOUNTER — Other Ambulatory Visit: Payer: Self-pay

## 2022-03-24 MED ORDER — NITROGLYCERIN 0.4 MG SL SUBL: 0.4000 mg | SUBLINGUAL_TABLET | SUBLINGUAL | 1 refills | Status: DC | PRN

## 2022-03-24 NOTE — Telephone Encounter (Signed)
Pt's medication was sent to his pharmacy as requested. Confirmation received.  
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
## 2022-04-14 ENCOUNTER — Encounter: Payer: Self-pay | Admitting: Cardiovascular Disease

## 2022-04-14 ENCOUNTER — Ambulatory Visit (INDEPENDENT_AMBULATORY_CARE_PROVIDER_SITE_OTHER): Payer: Self-pay | Admitting: Cardiovascular Disease

## 2022-04-14 VITALS — BP 162/90 | HR 58 | Ht 70.0 in | Wt 216.0 lb

## 2022-04-14 DIAGNOSIS — I10 Essential (primary) hypertension: Secondary | ICD-10-CM

## 2022-04-14 DIAGNOSIS — IMO0001 Reserved for inherently not codable concepts without codable children: Secondary | ICD-10-CM

## 2022-04-14 DIAGNOSIS — F17200 Nicotine dependence, unspecified, uncomplicated: Secondary | ICD-10-CM

## 2022-04-14 DIAGNOSIS — I2510 Atherosclerotic heart disease of native coronary artery without angina pectoris: Secondary | ICD-10-CM

## 2022-04-14 DIAGNOSIS — I255 Ischemic cardiomyopathy: Secondary | ICD-10-CM

## 2022-04-14 MED ORDER — LISINOPRIL 20 MG PO TABS: 10.0000 mg | ORAL_TABLET | Freq: Two times a day (BID) | ORAL | 3 refills | Status: DC

## 2022-04-14 MED ORDER — LISINOPRIL 20 MG PO TABS: 20.0000 mg | ORAL_TABLET | Freq: Every day | ORAL | 3 refills | Status: DC

## 2022-04-14 NOTE — Patient Instructions (Addendum)
Medication Instructions:  Your physician has recommended you make the following change in your medication:  1- Increase Lisinopril 20 mg by mouth daily.  *If you need a refill on your cardiac medications before your next appointment, please call your pharmacy*  Lab Work: Your physician recommends that you return for lab work in:  schedule for fasting lab work at same time as test. Lipid Panel, CMET and BMET  If you have labs (blood work) drawn today and your tests are completely normal, you will receive your results only by: MyChart Message (if you have MyChart) OR A paper copy in the mail If you have any lab test that is abnormal or we need to change your treatment, we will call you to review the results.  Testing/Procedures: Your physician has requested that you have an exercise tolerance test. For further information please visit [email or web address] Please also follow instruction sheet, as given.  Non-Cardiac CT scanning for lung cancer screening, (CAT scanning), is a noninvasive, special x-ray that produces cross-sectional images of the body using x-rays and a computer. CT scans help physicians diagnose and treat medical conditions. For some CT exams, a contrast material is used to enhance visibility in the area of the body being studied. CT scans provide greater clarity and reveal more details than regular x-ray exams.  Follow-Up: At [facility], you and your health needs are our priority.  As part of our continuing mission to provide you with exceptional heart care, we have created designated Provider Care Teams.  These Care Teams include your primary Cardiologist (physician) and Advanced Practice Providers (APPs -  Physician Assistants and Nurse Practitioners) who all work together to provide you with the care you need, when you need it.  We recommend signing up for the patient portal called "MyChart".  Sign up information is provided on this After Visit Summary.  MyChart is used to  connect with patients for Virtual Visits (Telemedicine).  Patients are able to view lab/test results, encounter notes, upcoming appointments, etc.  Non-urgent messages can be sent to your provider as well.   To learn more about what you can do with MyChart, go to [email or web address].    Your next appointment:   1 year(s)  The format for your next appointment:   In Person  Provider:   Charlton Haws, MD {   Important Information About Sugar

## 2022-04-14 NOTE — Addendum Note (Signed)
Addended by: Virl Axe, Fritzi Scripter L on: 04/14/2022 01:00 PM   Modules accepted: Orders

## 2022-04-14 NOTE — Addendum Note (Signed)
Addended by: Josue Hector on: 04/14/2022 01:49 PM   Modules accepted: Orders

## 2022-05-12 ENCOUNTER — Other Ambulatory Visit: Payer: Self-pay

## 2022-05-12 ENCOUNTER — Ambulatory Visit (INDEPENDENT_AMBULATORY_CARE_PROVIDER_SITE_OTHER)
Admission: RE | Admit: 2022-05-12 | Discharge: 2022-05-12 | Disposition: A | Discharge status: Home / Self Care | Payer: Self-pay | Source: Ambulatory Visit | Attending: Cardiovascular Disease | Admitting: Cardiovascular Disease

## 2022-05-12 ENCOUNTER — Ambulatory Visit (INDEPENDENT_AMBULATORY_CARE_PROVIDER_SITE_OTHER): Payer: Self-pay

## 2022-05-12 DIAGNOSIS — F17200 Nicotine dependence, unspecified, uncomplicated: Secondary | ICD-10-CM

## 2022-05-12 DIAGNOSIS — I2510 Atherosclerotic heart disease of native coronary artery without angina pectoris: Secondary | ICD-10-CM

## 2022-05-12 DIAGNOSIS — I255 Ischemic cardiomyopathy: Secondary | ICD-10-CM

## 2022-05-12 DIAGNOSIS — I10 Essential (primary) hypertension: Secondary | ICD-10-CM

## 2022-05-12 DIAGNOSIS — Z87891 Personal history of nicotine dependence: Secondary | ICD-10-CM

## 2022-05-12 LAB — CBC WITH DIFFERENTIAL/PLATELET
Basophils Absolute: 0.1 10*3/uL (ref 0.0–0.2)
Basos: 1 %
EOS (ABSOLUTE): 0.3 10*3/uL (ref 0.0–0.4)
Eos: 2 %
Hematocrit: 43.6 % (ref 37.5–51.0)
Hemoglobin: 14.9 g/dL (ref 13.0–17.7)
Immature Grans (Abs): 0 10*3/uL (ref 0.0–0.1)
Immature Granulocytes: 0 %
Lymphocytes Absolute: 2.9 10*3/uL (ref 0.7–3.1)
Lymphs: 23 %
MCH: 31.2 pg (ref 26.6–33.0)
MCHC: 34.2 g/dL (ref 31.5–35.7)
MCV: 91 fL (ref 79–97)
Monocytes Absolute: 1.1 10*3/uL — ABNORMAL HIGH (ref 0.1–0.9)
Monocytes: 8 %
Neutrophils Absolute: 8.2 10*3/uL — ABNORMAL HIGH (ref 1.4–7.0)
Neutrophils: 66 %
Platelets: 244 10*3/uL (ref 150–450)
RBC: 4.78 x10E6/uL (ref 4.14–5.80)
RDW: 12.1 % (ref 11.6–15.4)
WBC: 12.6 10*3/uL — ABNORMAL HIGH (ref 3.4–10.8)

## 2022-05-12 LAB — COMPREHENSIVE METABOLIC PANEL
ALT: 30 IU/L (ref 0–44)
AST: 22 IU/L (ref 0–40)
Albumin/Globulin Ratio: 2 (ref 1.2–2.2)
Albumin: 4.2 g/dL (ref 4.0–5.0)
Alkaline Phosphatase: 94 IU/L (ref 44–121)
BUN/Creatinine Ratio: 16 (ref 9–20)
BUN: 12 mg/dL (ref 6–24)
Bilirubin Total: 0.9 mg/dL (ref 0.0–1.2)
CO2: 27 mmol/L (ref 20–29)
Calcium: 9.3 mg/dL (ref 8.7–10.2)
Chloride: 102 mmol/L (ref 96–106)
Creatinine, Ser: 0.76 mg/dL (ref 0.76–1.27)
Globulin, Total: 2.1 g/dL (ref 1.5–4.5)
Glucose: 86 mg/dL (ref 70–99)
Potassium: 5.2 mmol/L (ref 3.5–5.2)
Sodium: 137 mmol/L (ref 134–144)
Total Protein: 6.3 g/dL (ref 6.0–8.5)
eGFR: 109 mL/min/{1.73_m2} (ref >59)

## 2022-05-12 LAB — EXERCISE TOLERANCE TEST
Angina Index: 0
Duke Treadmill Score: 10
Estimated workload: 11.6
Exercise duration (min): 9 min
Exercise duration (sec): 58 s
MPHR: 170 {beats}/min
Peak HR: 148 {beats}/min
Percent HR: 87 %
RPE: 16
Rest HR: 64 {beats}/min
ST Depression (mm): 0 mm

## 2022-05-12 LAB — LIPID PANEL
Chol/HDL Ratio: 2.5 ratio (ref 0.0–5.0)
Cholesterol, Total: 105 mg/dL (ref 100–199)
HDL: 42 mg/dL (ref >39)
LDL Chol Calc (NIH): 43 mg/dL (ref 0–99)
Triglycerides: 107 mg/dL (ref 0–149)
VLDL Cholesterol Cal: 20 mg/dL (ref 5–40)

## 2022-05-19 ENCOUNTER — Telehealth: Payer: Self-pay | Admitting: Cardiovascular Disease

## 2022-05-19 NOTE — Telephone Encounter (Signed)
The patient's girlfriend (DPR) has been notified of the result and verbalized understanding.  All questions (if any) were answered. Ethelda Chick, RN 05/19/2022 10:33 AM

## 2022-05-19 NOTE — Telephone Encounter (Signed)
Pt is calling back in regards to CT results. Requesting call back.  

## 2022-06-15 ENCOUNTER — Other Ambulatory Visit: Payer: Self-pay | Admitting: Cardiovascular Disease

## 2022-06-15 DIAGNOSIS — E785 Hyperlipidemia, unspecified: Secondary | ICD-10-CM

## 2022-08-11 ENCOUNTER — Other Ambulatory Visit: Payer: Self-pay

## 2022-08-11 DIAGNOSIS — I255 Ischemic cardiomyopathy: Secondary | ICD-10-CM

## 2022-08-11 MED ORDER — CARVEDILOL 3.125 MG PO TABS: 3.1250 mg | ORAL_TABLET | Freq: Two times a day (BID) | ORAL | 1 refills | Status: DC

## 2022-08-11 MED ORDER — SPIRONOLACTONE 25 MG PO TABS: ORAL_TABLET | ORAL | 2 refills | Status: DC

## 2022-08-11 NOTE — Telephone Encounter (Signed)
Pt's medication was sent to pt's pharmacy as requested. Confirmation received.  °

## 2023-01-30 ENCOUNTER — Telehealth: Payer: Self-pay | Admitting: Cardiovascular Disease

## 2023-01-30 MED ORDER — LISINOPRIL 20 MG PO TABS: 20.0000 mg | ORAL_TABLET | Freq: Two times a day (BID) | ORAL | 11 refills | Status: DC

## 2023-01-30 NOTE — Telephone Encounter (Signed)
Patient was previously on lisinopril 10 mg twice daily. This was increased to 20 mg twice daily, however prescription was sent in for '20mg'$  once daily. Patient is out of medication. Refill has been sent in with updated prescription.

## 2023-01-30 NOTE — Telephone Encounter (Signed)
Pt c/o medication issue:  1. Name of Medication: lisinopril (ZESTRIL) 20 MG tablet   2. How are you currently taking this medication (dosage and times per day)? 2 x daily   3. Are you having a reaction (difficulty breathing--STAT)?   4. What is your medication issue? Patient states pharmacy is not feeling it like he usually takes it which is 2 x daily. Requesting call back to get clarification.

## 2023-04-04 ENCOUNTER — Encounter (HOSPITAL_COMMUNITY): Payer: Self-pay

## 2023-04-04 ENCOUNTER — Ambulatory Visit (HOSPITAL_COMMUNITY)
Admission: EM | Admit: 2023-04-04 | Discharge: 2023-04-04 | Disposition: A | Discharge status: Home / Self Care | Payer: Self-pay | Attending: Family Medicine | Admitting: Family Medicine

## 2023-04-04 DIAGNOSIS — K219 Gastro-esophageal reflux disease without esophagitis: Secondary | ICD-10-CM

## 2023-04-04 DIAGNOSIS — R1013 Epigastric pain: Secondary | ICD-10-CM

## 2023-04-04 MED ORDER — ALUM & MAG HYDROXIDE-SIMETH 200-200-20 MG/5ML PO SUSP
ORAL | Status: AC
  Filled 2023-04-04: qty 30

## 2023-04-04 MED ORDER — LIDOCAINE VISCOUS HCL 2 % MT SOLN
15.0000 mL | Freq: Once | OROMUCOSAL | Status: AC
  Administered 2023-04-04: 15 mL via OROMUCOSAL

## 2023-04-04 MED ORDER — ALUM & MAG HYDROXIDE-SIMETH 200-200-20 MG/5ML PO SUSP
30.0000 mL | Freq: Once | ORAL | Status: AC
  Administered 2023-04-04: 30 mL via ORAL

## 2023-04-04 MED ORDER — SIMETHICONE 80 MG PO CHEW: 80.0000 mg | CHEWABLE_TABLET | Freq: Four times a day (QID) | ORAL | 0 refills | Status: DC | PRN

## 2023-04-04 MED ORDER — LIDOCAINE VISCOUS HCL 2 % MT SOLN
OROMUCOSAL | Status: AC
  Filled 2023-04-04: qty 15

## 2023-04-04 MED ORDER — OMEPRAZOLE 20 MG PO CPDR: 20.0000 mg | DELAYED_RELEASE_CAPSULE | Freq: Every day | ORAL | 0 refills | Status: DC

## 2023-04-04 MED ORDER — DICYCLOMINE HCL 10 MG/5ML PO SOLN: 10.0000 mg | Freq: Once | ORAL | Status: DC

## 2023-04-04 NOTE — ED Triage Notes (Signed)
Pt states mid abdominal pain for the past 3 days.  States he has not taken anything at home for the pain.
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
## 2023-04-04 NOTE — Discharge Instructions (Addendum)
Overall your symptoms are consistent with acid reflux.  You can take the daily omeprazole and then a simethicone as needed. I suggest cutting back on alcohol and cigarette intake, as these can lead to worsening of acid reflux.  Please eat small frequent meals and do not lay down after meals.  Please follow-up with a gastroenterologist attached, you can call and schedule an appointment for further evaluation.  Please seek immediate care or call 911 if you develop blood in your stool, bloody vomiting, worsening of abdominal pain, or any new concerning symptoms.  Your blood pressure was also elevated today in clinic, please be sure you are taking your antihypertensive medications and following up with cardiology as advised.

## 2023-04-04 NOTE — ED Notes (Signed)
Pt reports relief of his symptoms after the gi cocktail.

## 2023-05-10 ENCOUNTER — Other Ambulatory Visit: Payer: Self-pay | Admitting: Cardiovascular Disease

## 2023-05-10 DIAGNOSIS — I255 Ischemic cardiomyopathy: Secondary | ICD-10-CM
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
## 2023-07-05 ENCOUNTER — Ambulatory Visit: Payer: Self-pay | Attending: Cardiovascular Disease | Admitting: Cardiovascular Disease

## 2023-07-05 ENCOUNTER — Other Ambulatory Visit: Payer: Self-pay | Admitting: Cardiovascular Disease

## 2023-07-05 ENCOUNTER — Encounter: Payer: Self-pay | Admitting: Cardiovascular Disease

## 2023-07-05 VITALS — BP 162/92 | HR 65 | Ht 70.0 in | Wt 215.0 lb

## 2023-07-05 DIAGNOSIS — F17200 Nicotine dependence, unspecified, uncomplicated: Secondary | ICD-10-CM

## 2023-07-05 DIAGNOSIS — I2510 Atherosclerotic heart disease of native coronary artery without angina pectoris: Secondary | ICD-10-CM

## 2023-07-05 DIAGNOSIS — I255 Ischemic cardiomyopathy: Secondary | ICD-10-CM

## 2023-07-05 DIAGNOSIS — E785 Hyperlipidemia, unspecified: Secondary | ICD-10-CM

## 2023-07-05 DIAGNOSIS — Z720 Tobacco use: Secondary | ICD-10-CM

## 2023-07-05 DIAGNOSIS — I10 Essential (primary) hypertension: Secondary | ICD-10-CM

## 2023-07-05 MED ORDER — SPIRONOLACTONE 25 MG PO TABS: ORAL_TABLET | ORAL | 3 refills | Status: DC

## 2023-07-05 MED ORDER — LISINOPRIL 20 MG PO TABS: 20.0000 mg | ORAL_TABLET | Freq: Two times a day (BID) | ORAL | 3 refills | Status: DC

## 2023-07-05 MED ORDER — CARVEDILOL 3.125 MG PO TABS: 3.1250 mg | ORAL_TABLET | Freq: Two times a day (BID) | ORAL | 3 refills | Status: DC

## 2023-07-05 MED ORDER — ATORVASTATIN CALCIUM 80 MG PO TABS: 80.0000 mg | ORAL_TABLET | Freq: Every day | ORAL | 3 refills | Status: DC

## 2023-07-05 NOTE — Patient Instructions (Addendum)
Medication Instructions:  Your physician recommends that you continue on your current medications as directed. Please refer to the Current Medication list given to you today.  *If you need a refill on your cardiac medications before your next appointment, please call your pharmacy*  Lab Work: Your physician recommends that you return for lab work one day fasting for CBC, CMET, TSH, A1c, PSA, and lipid panel  If you have labs (blood work) drawn today and your tests are completely normal, you will receive your results only by: MyChart Message (if you have MyChart) OR A paper copy in the mail If you have any lab test that is abnormal or we need to change your treatment, we will call you to review the results.  Testing/Procedures: Lung Cancer Screening CT.  Follow-Up: At [facility], you and your health needs are our priority.  As part of our continuing mission to provide you with exceptional heart care, we have created designated Provider Care Teams.  These Care Teams include your primary Cardiologist (physician) and Advanced Practice Providers (APPs -  Physician Assistants and Nurse Practitioners) who all work together to provide you with the care you need, when you need it.  We recommend signing up for the patient portal called "MyChart".  Sign up information is provided on this After Visit Summary.  MyChart is used to connect with patients for Virtual Visits (Telemedicine).  Patients are able to view lab/test results, encounter notes, upcoming appointments, etc.  Non-urgent messages can be sent to your provider as well.   To learn more about what you can do with MyChart, go to [email or web address].    Your next appointment:   6 months  Provider:   Charlton Haws, MD

## 2023-07-06 ENCOUNTER — Other Ambulatory Visit: Payer: Self-pay | Admitting: Cardiovascular Disease

## 2023-07-10 ENCOUNTER — Ambulatory Visit: Payer: Self-pay

## 2023-07-10 ENCOUNTER — Ambulatory Visit (HOSPITAL_COMMUNITY)
Admission: RE | Admit: 2023-07-10 | Discharge: 2023-07-10 | Disposition: A | Discharge status: Home / Self Care | Payer: Self-pay | Source: Ambulatory Visit | Attending: Cardiovascular Disease | Admitting: Cardiovascular Disease

## 2023-07-10 DIAGNOSIS — F17200 Nicotine dependence, unspecified, uncomplicated: Secondary | ICD-10-CM | POA: Insufficient documentation

## 2023-07-10 DIAGNOSIS — I10 Essential (primary) hypertension: Secondary | ICD-10-CM

## 2023-07-10 DIAGNOSIS — Z720 Tobacco use: Secondary | ICD-10-CM | POA: Insufficient documentation

## 2023-07-10 DIAGNOSIS — I255 Ischemic cardiomyopathy: Secondary | ICD-10-CM | POA: Insufficient documentation

## 2023-07-10 DIAGNOSIS — I2510 Atherosclerotic heart disease of native coronary artery without angina pectoris: Secondary | ICD-10-CM

## 2023-07-10 LAB — CBC WITH DIFFERENTIAL/PLATELET

## 2023-07-10 LAB — PSA: Prostate Specific Ag, Serum: 0.6 ng/mL (ref 0.0–4.0)

## 2023-07-10 LAB — COMPREHENSIVE METABOLIC PANEL
ALT: 22 IU/L (ref 0–44)
AST: 21 IU/L (ref 0–40)
Albumin: 4.2 g/dL (ref 3.8–4.9)
Alkaline Phosphatase: 100 IU/L (ref 44–121)
BUN/Creatinine Ratio: 17 (ref 9–20)
BUN: 14 mg/dL (ref 6–24)
Bilirubin Total: 0.5 mg/dL (ref 0.0–1.2)
CO2: 24 mmol/L (ref 20–29)
Calcium: 9.4 mg/dL (ref 8.7–10.2)
Chloride: 105 mmol/L (ref 96–106)
Creatinine, Ser: 0.84 mg/dL (ref 0.76–1.27)
Globulin, Total: 2.3 g/dL (ref 1.5–4.5)
Glucose: 95 mg/dL (ref 70–99)
Potassium: 4.9 mmol/L (ref 3.5–5.2)
Sodium: 138 mmol/L (ref 134–144)
Total Protein: 6.5 g/dL (ref 6.0–8.5)
eGFR: 106 mL/min/{1.73_m2} (ref >59)

## 2023-07-10 LAB — LIPID PANEL
Chol/HDL Ratio: 2.3 ratio (ref 0.0–5.0)
Cholesterol, Total: 105 mg/dL (ref 100–199)
HDL: 45 mg/dL (ref >39)
LDL Chol Calc (NIH): 44 mg/dL (ref 0–99)
Triglycerides: 81 mg/dL (ref 0–149)
VLDL Cholesterol Cal: 16 mg/dL (ref 5–40)

## 2023-07-10 LAB — TSH: TSH: 1.84 u[IU]/mL (ref 0.450–4.500)

## 2023-07-10 LAB — HEMOGLOBIN A1C

## 2023-07-19 ENCOUNTER — Telehealth: Payer: Self-pay | Admitting: Cardiovascular Disease

## 2023-07-19 NOTE — Telephone Encounter (Signed)
Per Dr. Eden Emms, No lung cancer and had normal stress test last year known CAD. Patient aware of  results.

## 2023-07-19 NOTE — Telephone Encounter (Signed)
Patient returned RN's call regarding results. 

## 2023-10-20 ENCOUNTER — Other Ambulatory Visit: Payer: Self-pay | Admitting: Cardiovascular Disease

## 2024-02-15 ENCOUNTER — Encounter: Payer: Self-pay | Admitting: Cardiology

## 2024-02-15 ENCOUNTER — Ambulatory Visit: Payer: Self-pay | Attending: Cardiology | Admitting: Cardiology

## 2024-02-15 VITALS — BP 156/106 | HR 73 | Ht 70.0 in | Wt 220.6 lb

## 2024-02-15 DIAGNOSIS — E782 Mixed hyperlipidemia: Secondary | ICD-10-CM | POA: Insufficient documentation

## 2024-02-15 DIAGNOSIS — I255 Ischemic cardiomyopathy: Secondary | ICD-10-CM

## 2024-02-15 DIAGNOSIS — I10 Essential (primary) hypertension: Secondary | ICD-10-CM | POA: Insufficient documentation

## 2024-02-15 DIAGNOSIS — I2510 Atherosclerotic heart disease of native coronary artery without angina pectoris: Secondary | ICD-10-CM

## 2024-02-15 DIAGNOSIS — F17200 Nicotine dependence, unspecified, uncomplicated: Secondary | ICD-10-CM | POA: Insufficient documentation

## 2024-02-15 MED ORDER — AMLODIPINE BESYLATE 5 MG PO TABS: 5.0000 mg | ORAL_TABLET | Freq: Every day | ORAL | 3 refills | Status: DC

## 2024-02-15 MED ORDER — CARVEDILOL 6.25 MG PO TABS: 6.2500 mg | ORAL_TABLET | Freq: Two times a day (BID) | ORAL | 3 refills | Status: DC

## 2024-02-15 NOTE — Patient Instructions (Addendum)
 Medication Instructions:  START Carvedilol 6.25 mg take one tablet by mouth twice daily  START Amlodipine 5 mg take one tablet by mouth daily   *If you need a refill on your cardiac medications before your next appointment, please call your pharmacy*  REFERRAL TO PHARM D  REFERRAL FOR CARE MANAGEMENT   Follow-Up: At [facility], you and your health needs are our priority.  As part of our continuing mission to provide you with exceptional heart care, we have created designated Provider Care Teams.  These Care Teams include your primary Cardiologist (physician) and Advanced Practice Providers (APPs -  Physician Assistants and Nurse Practitioners) who all work together to provide you with the care you need, when you need it.  We recommend signing up for the patient portal called "MyChart".  Sign up information is provided on this After Visit Summary.  MyChart is used to connect with patients for Virtual Visits (Telemedicine).  Patients are able to view lab/test results, encounter notes, upcoming appointments, etc.  Non-urgent messages can be sent to your provider as well.   To learn more about what you can do with MyChart, go to [email or web address].    Your next appointment:   Follow up scheduled   Provider:   Perlie Gold, PA-C      Other Instructions   1st Floor: - Lobby - Registration  - Pharmacy  - Lab - Cafe  2nd Floor: - PV Lab - Diagnostic Testing (echo, CT, nuclear med)  3rd Floor: - Vacant  4th Floor: - TCTS (cardiothoracic surgery) - AFib Clinic - Structural Heart Clinic - Vascular Surgery  - Vascular Ultrasound  5th Floor: - HeartCare Cardiology (general and EP) - Clinical Pharmacy for coumadin, hypertension, lipid, weight-loss medications, and med management appointments    Valet parking services will be available as well.

## 2024-02-15 NOTE — Progress Notes (Signed)
 Cardiology Office Note:   Date:  02/15/2024  ID:  Henry Walters, DOB [birth year], MRN [id number] PCP: Patient, No Pcp Per  [facility] Providers Cardiologist:  Charlton Haws, MD    History of Present Illness:   Discussed the use of AI scribe software for clinical note transcription with the patient, who gave verbal consent to proceed.  Henry Walters is a 53 year old male with coronary artery disease and heart failure with recovered ejection fraction who presents for routine cardiology follow-up.  He has a history of coronary artery disease and underwent cardiac catheterization in 2016, which revealed single vessel coronary obstructive disease with total occlusion of a small interlateral branch of the first diagonal vessel of the LAD. This branch was very small in caliber, approximately 1.5 mm in diameter, and medical management was recommended. He has had no chest pain since the procedure and does not recall the symptoms that led to the catheterization. He says he was initially referred from urgent care for bronchitis, which led to the discovery of his heart condition. He mentions occasional pain in the area where he previously broke his ribs, but this pain is intermittent and not related to exertion. 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   He has a history of heart failure with recovered ejection fraction and is currently asymptomatic, NYHA I He continues on carvedilol and lisinopril for management.  Hypertension is a concern with a recent blood pressure reading of 156/106 mmHg. He is currently taking carvedilol 3.125mg  BID, lisinopril 20mg  BID, spironolactone 12.5mg . He also takes atorvastatin for cholesterol management. He has not been monitoring his blood pressure at home despite having a blood pressure machine.  He continues to smoke approximately a pack of cigarettes per day and consumes about two  twelve-packs of alcohol per week. He wants to quit smoking to save money but has not yet taken steps to do so.  He reports significant recent family stress, including the hospitalization of his son and the deaths of his mother and sister within the past few weeks. He has family support from his little sister, older sister, and son, who live locally.  No chest pain, shortness of breath, swelling in the legs, dizziness, lightheadedness, stomach problems, blood in urine or stool, or headaches.      Studies Reviewed:    03/06/20 TTE  IMPRESSIONS     1. Left ventricular ejection fraction, by estimation, is 55 to 60%. Left  ventricular ejection fraction by 3D volume is 56 %. The left ventricle has  normal function. The left ventricle has no regional wall motion  abnormalities. There is mild asymmetric  left ventricular hypertrophy of the basal-septal segment. Left ventricular  diastolic parameters were normal. The average left ventricular global  longitudinal strain is -19.5 %.   2. Right ventricular systolic function is normal. The right ventricular  size is normal. There is normal pulmonary artery systolic pressure.   3. The mitral valve is normal in structure. Trivial mitral valve  regurgitation.   4. The aortic valve is tricuspid. Aortic valve regurgitation is not  visualized. No aortic stenosis is present.   FINDINGS   Left Ventricle: Left ventricular ejection fraction, by estimation, is 55  to 60%. Left ventricular ejection fraction by 3D volume is 56 %. The left  ventricle has normal function. The left ventricle has no regional wall  motion abnormalities. The average  left ventricular  global longitudinal strain is -19.5 %. The left  ventricular internal cavity size was normal in size. There is mild  asymmetric left ventricular hypertrophy of the basal-septal segment. Left  ventricular diastolic parameters were normal.   Right Ventricle: The right ventricular size is normal. No  increase in  right ventricular wall thickness. Right ventricular systolic function is  normal. There is normal pulmonary artery systolic pressure. The tricuspid  regurgitant velocity is 1.91 m/s, and   with an assumed right atrial pressure of 8 mmHg, the estimated right  ventricular systolic pressure is 22.6 mmHg.   Left Atrium: Left atrial size was normal in size.   Right Atrium: Right atrial size was normal in size.   Pericardium: There is no evidence of pericardial effusion.   Mitral Valve: The mitral valve is normal in structure. Trivial mitral  valve regurgitation.   Tricuspid Valve: The tricuspid valve is normal in structure. Tricuspid  valve regurgitation is trivial.   Aortic Valve: The aortic valve is tricuspid. Aortic valve regurgitation is  not visualized. No aortic stenosis is present.   Pulmonic Valve: The pulmonic valve was grossly normal. Pulmonic valve  regurgitation is trivial.   Aorta: The aortic root and ascending aorta are structurally normal, with  no evidence of dilitation.   IAS/Shunts: No atrial level shunt detected by color flow Doppler.   Risk Assessment/Calculations:     HYPERTENSION CONTROL Vitals:   02/15/24 1540 02/15/24 1645  BP: (!) 144/100 (!) 156/106    The patient's blood pressure is elevated above target today.  In order to address the patient's elevated BP: A current anti-hypertensive medication was adjusted today.; A referral to the [facility] will be placed.           Physical Exam:   VS:  BP (!) 156/106   Pulse 73   Ht 5\' 10"  (1.778 m)   Wt 220 lb 9.6 oz (100.1 kg)   SpO2 96%   BMI 31.65 kg/m    Wt Readings from Last 3 Encounters:  02/15/24 220 lb 9.6 oz (100.1 kg)  07/05/23 215 lb (97.5 kg)  04/14/22 216 lb (98 kg)     Physical Exam Vitals reviewed.  Constitutional:      Appearance: Normal appearance.  HENT:     Head: Normocephalic.  Cardiovascular:     Rate and Rhythm: Normal rate and regular  rhythm.     Pulses: Normal pulses.     Heart sounds: Normal heart sounds.  Pulmonary:     Effort: Pulmonary effort is normal.     Breath sounds: Normal breath sounds.  Abdominal:     General: Abdomen is flat.     Palpations: Abdomen is soft.  Musculoskeletal:     Right lower leg: No edema.     Left lower leg: No edema.  Skin:    General: Skin is warm and dry.     Capillary Refill: Capillary refill takes less than 2 seconds.  Neurological:     General: No focal deficit present.     Mental Status: He is alert and oriented to person, place, and time.  Psychiatric:        Mood and Affect: Mood normal.        Behavior: Behavior normal.        Thought Content: Thought content normal.        Judgment: Judgment normal.     ASSESSMENT AND PLAN:       Hypertension Blood pressure is elevated at  158/106 mmHg. Hypertension management includes carvedilol, lisinopril, and spironolactone. Control is challenging due to tobacco and alcohol use. No recent home monitoring despite having a cuff. Increasing carvedilol and adding amlodipine to better control elevated diastolic pressure. - Increase carvedilol to 6.25 mg twice daily - Add amlodipine 5 mg daily - Continue Spironolactone 12.5mg . Did not increase today with borderline elevated K per last BMET. - Schedule follow-up with clinical pharmacist in 2-3 weeks to monitor blood pressure - Encourage reduction in tobacco and alcohol use - Submit social work request to assist in finding a primary care provider  Coronary Artery Disease Coronary artery disease with single vessel obstructive disease and total occlusion of a small interlateral branch of the first diagonal vessel of the LAD. No current chest pain or angina symptoms. Discussed how smoking is a significant risk factor for myocardial infarction. Advised to monitor for new or different chest symptoms as smoking increases heart attack risk. - Continue aspirin, atorvastatin, carvedilol,  lisinopril, and spironolactone - Advise on smoking cessation to reduce cardiac risk - Monitor for new or different chest symptoms  Heart Failure with Recovered Ejection Fraction Heart failure with recovered ejection fraction. Asymptomatic with no shortness of breath, edema, or other heart failure symptoms. Engages in physical activity without limitations. - Continue current heart failure management regimen  Hyperlipidemia Hyperlipidemia is well-controlled with atorvastatin. Last cholesterol check in August 2024 showed good control, LDL 44. - Continue atorvastatin - Recheck lipid panel at next annual visit  Tobacco Use Smokes approximately one pack per day. Acknowledges financial burden and is open to considering medication for cessation.  - Discuss smoking cessation options and considering pharmacotherapy to aid in quitting  Alcohol Use Consumes approximately two twelve-packs of beer per week. Alcohol use contributes to difficulty in controlling blood pressure. - Advise reduction in alcohol consumption to aid in blood pressure control     Social challenges Patient currently without health insurance or PCP. Discussed the importance of regular primary care especially in light of significant smoking history. - Place referral to social work for assistance with PCP access/lower cost primary care.        Signed, Perlie Gold, PA-C

## 2024-02-16 ENCOUNTER — Telehealth: Payer: Self-pay | Admitting: Licensed Clinical Social Worker

## 2024-02-16 ENCOUNTER — Ambulatory Visit: Payer: Self-pay | Admitting: Cardiology

## 2024-02-16 NOTE — Telephone Encounter (Signed)
 H&V Care Navigation CSW Progress Note  Clinical Social Worker contacted patient by phone to f/u on referral for assistance as pt has no PCP and no insurance coverage at this time. LCSW left voicemail at [phone]. Will re-attempt again next week.  Patient is participating in a Managed Medicaid Plan:  No, self pay only  SDOH Screenings   Tobacco Use: High Risk (02/15/2024)    Octavio Graves, MSW, LCSW Clinical Social Worker II [facility] Heart/Vascular Care Navigation  [phone]- work cell phone (preferred) [phone]- desk phone

## 2024-02-20 ENCOUNTER — Telehealth: Payer: Self-pay | Admitting: Licensed Clinical Social Worker

## 2024-02-20 NOTE — Telephone Encounter (Signed)
 H&V Care Navigation CSW Progress Note  Clinical Social Worker contacted patient by phone to f/u on referral for assistance as pt has no PCP and no insurance coverage at this time. LCSW left voicemail at [phone]. Will re-attempt again later this week.  Patient is participating in a Managed Medicaid Plan:  No, self pay only  SDOH Screenings   Tobacco Use: High Risk (02/15/2024)    Octavio Graves, MSW, LCSW Clinical Social Worker II [facility] Heart/Vascular Care Navigation  [phone]- work cell phone (preferred) [phone]- desk phone

## 2024-02-21 ENCOUNTER — Telehealth: Payer: Self-pay | Admitting: Licensed Clinical Social Worker

## 2024-02-21 NOTE — Telephone Encounter (Signed)
 H&V Care Navigation CSW Progress Note  Clinical Social Worker contacted patient by phone to f/u after appt. Was unable to again reach pt at [phone]. Instead of leaving third voicemail I tried additional number listed [phone].   Pt significant other Kandi answered. I shared that the last DPR we have on file was from 2018 and therefore I could not share pt details but asked her to please have pt return my calls as I have called twice and left voicemail messages hoping to provide pt further assistance. Thornell Sartorius states pt will f/u with pt when he picks her up from work at [employer]. Remain available should pt return my call.  Patient is participating in a Managed Medicaid Plan:  No self pay only  SDOH Screenings   Tobacco Use: High Risk (02/15/2024)   Octavio Graves, MSW, LCSW Clinical Social Worker II [facility] Heart/Vascular Care Navigation  [phone]- work cell phone (preferred) [phone]- desk phone

## 2024-02-22 ENCOUNTER — Telehealth: Payer: Self-pay | Admitting: Licensed Clinical Social Worker

## 2024-02-22 NOTE — Telephone Encounter (Signed)
 H&V Care Navigation CSW Progress Note  Clinical Social Worker contacted patient by phone to f/u on referral for assistance as pt has no PCP and no insurance coverage at this time. LCSW left 3rd voicemail at [phone]. Did not receive call back after asking pt significant other to f/u with pt yesterday. Will mail pt applications and information about assistance to home address. Remain available should pt return call/engage with pt assistance.  Patient is participating in a Managed Medicaid Plan:  No, self pay only per chart  SDOH Screenings   Tobacco Use: High Risk (02/15/2024)     Octavio Graves, MSW, LCSW Clinical Social Worker II [facility] Heart/Vascular Care Navigation  [phone]- work cell phone (preferred) [phone]- desk phone

## 2024-02-27 ENCOUNTER — Telehealth (HOSPITAL_BASED_OUTPATIENT_CLINIC_OR_DEPARTMENT_OTHER): Payer: Self-pay | Admitting: Licensed Clinical Social Worker

## 2024-02-27 NOTE — Telephone Encounter (Signed)
 H&V Care Navigation CSW Progress Note  Clinical Social Worker contacted patient by phone to f/u again as pt had called me back and left voicemail on clinic day where I was off (3/31). Left additional message at [phone], let him know I was out of office and left information about my hours on his voicemail. Encouraged him to call me if interested, also let him know I mailed resources for his review if easier. Remain available should pt return my call.  Patient is participating in a Managed Medicaid Plan:  No, self pay only  SDOH Screenings   Tobacco Use: High Risk (02/15/2024)    Octavio Graves, MSW, LCSW Clinical Social Worker II [facility] Heart/Vascular Care Navigation  [phone]- work cell phone (preferred) [phone]- desk phone

## 2024-02-29 NOTE — Progress Notes (Signed)
 Heart and Vascular Care Navigation  02/27/2024  JAYVAN MCSHAN 12/16/1970 [id number]  Reason for Referral: self pay Patient is participating in a Managed Medicaid Plan: No, self pay only  Engaged with patient by telephone for initial visit for Heart and Vascular Care Coordination.                                                                                                   Assessment:               LCSW was able to reach pt at [phone] since he returned my call. Confirmed home address, PCP, and emergency contact is Kandi. Pt shares he works in [employer] so his income is often variable. He hasn't applied for Medicaid- isnt sure if his income is in guidelines but open to referral to [place] DSS [facility] team since his [place] address is actually just in the [place]. He is interested in CAFA and Medication assistance if he is not eligible for Medicaid. He is aware I mailed him information and is open to calling me back when items received.   Otherwise no additional questions/concerns at this time. Discussed establishing with PCP, mailed resources. He denies any issues obtaining or affording food, housing, utilities or transportation at this time. Encouraged him to let me know if any needs do arise in these areas we can provide additional assistance with at that time.    Will f/u with pt.                  HRT/VAS Care Coordination     Patients Home Cardiology Office [facility] [address]   Outpatient Care Team Social Worker   Social Worker Name: Nile Riggs, [phone]   Living arrangements for the past 2 months Single Family Home   Lives with: Self; Significant Other   Patient Current Insurance Coverage Self-Pay   Patient Has Concern With Paying Medical Bills Yes   Patient Concerns With Medical Bills uninsured- variable income   Medical Bill Referrals: Medicaid referral made;  sent CAFA if ineligible   Does Patient Have Prescription  Coverage? No   Home Assistive Devices/Equipment None       Social History:                                                                             SDOH Screenings   Food Insecurity: No Food Insecurity (02/29/2024)  Housing: Low Risk  (02/29/2024)  Transportation Needs: No Transportation Needs (02/29/2024)  Utilities: Not At Risk (02/29/2024)  Financial Resource Strain: Medium Risk (02/29/2024)  Tobacco Use: High Risk (02/15/2024)  Health Literacy: Adequate Health Literacy (02/29/2024)    SDOH Interventions: Financial Resources:  Surveyor, quantity Strain Interventions: [employer] Provided (referred for [employer] screening; sent [employer] and [employer]  to pt- will f/u) DSS for financial assistance and Financial Counseling for [employer] Program  Food Insecurity:  Food Insecurity Interventions: Intervention Not Indicated  Housing Insecurity:  Housing Interventions: Intervention Not Indicated  Transportation:   Transportation Interventions: Intervention Not Indicated    Other Care Navigation Interventions:     Provided Pharmacy assistance resources  Pt mailed NCMedassist application if eligible  Patient expressed Mental Health concerns No.   Follow-up plan:   LCSW mailed pt the following prior to call: my contact information, Medicaid flyer, [employer], and NCMedAssist application. Pt referred to Medicaid caseworkers at [facility]. Cathlyn Parsons, Medicaid caseworker [place] DSS, will attempt pt to assist with submitting application.

## 2024-03-07 ENCOUNTER — Telehealth: Payer: Self-pay | Admitting: Licensed Clinical Social Worker

## 2024-03-07 NOTE — Telephone Encounter (Signed)
 H&V Care Navigation CSW Progress Note  Clinical Social Worker contacted patient by phone to follow up on resources sent in mail and referral to Medicaid case worker. No answer this morning at [phone], encouraged pt to call me after he gets off work today or tomorrow and provided clinic hours. Will re-attempt again if I cannot reach him.   Patient is participating in a Managed Medicaid Plan:  No, self pay only  SDOH Screenings   Food Insecurity: No Food Insecurity (02/29/2024)  Housing: Low Risk  (02/29/2024)  Transportation Needs: No Transportation Needs (02/29/2024)  Utilities: Not At Risk (02/29/2024)  Financial Resource Strain: Medium Risk (02/29/2024)  Tobacco Use: High Risk (02/15/2024)  Health Literacy: Adequate Health Literacy (02/29/2024)    Henry Walters, MSW, LCSW Clinical Social Worker II [facility] Heart/Vascular Care Navigation  [phone]- work cell phone (preferred) [phone]- desk phone

## 2024-03-22 ENCOUNTER — Telehealth (HOSPITAL_BASED_OUTPATIENT_CLINIC_OR_DEPARTMENT_OTHER): Payer: Self-pay | Admitting: Licensed Clinical Social Worker

## 2024-03-22 NOTE — Telephone Encounter (Signed)
 H&V Care Navigation CSW Progress Note  Clinical Social Worker contacted patient by phone to follow up on resources sent in mail and referral to Medicaid case worker. No answer this morning again at [phone], left voicemail, remain available should pt return my call.  Patient is participating in a Managed Medicaid Plan:  No, self pay only  SDOH Screenings   Food Insecurity: No Food Insecurity (02/29/2024)  Housing: Low Risk  (02/29/2024)  Transportation Needs: No Transportation Needs (02/29/2024)  Utilities: Not At Risk (02/29/2024)  Financial Resource Strain: Medium Risk (02/29/2024)  Tobacco Use: High Risk (02/15/2024)  Health Literacy: Adequate Health Literacy (02/29/2024)    Nathen Balder, MSW, LCSW Clinical Social Worker II [facility] Heart/Vascular Care Navigation  [phone]- work cell phone (preferred) [phone]- desk phone

## 2024-04-10 NOTE — Progress Notes (Unsigned)
 Cardiology Office Note:   Date:  04/11/2024  ID:  Henry Walters, DOB 1971-08-12, MRN [id number] PCP: Patient, No Pcp Per  [facility] Providers Cardiologist:  Janelle Mediate, MD    History of Present Illness:    Discussed the use of AI scribe software for clinical note transcription with the patient, who gave verbal consent to proceed.  History of Present Illness Henry Walters is a 53 year old male with coronary artery disease and heart failure with recovered ejection fraction, hypertension, hyperlipidemia, tobacco use, alcohol use.   At last visit, patient noted to be hypertensive. Coreg  increased to 6.25mg  BID and Amlodipine  5mg  added. Patient also referred to PharmD HTN clinic, scheduled for later this month. Patient also was noted to be without a primary care provider/without insurance. Assistance request submitted to social work team but it appears that they have had trouble reaching patient.   He has been experiencing elevated blood pressure despite adherence to his medication regimen, which includes aspirin , lisinopril , carvedilol , a cholesterol medication, and spironolactone . He has recently started monitoring his blood pressure at home, noting that it is 'fine' when relaxed, with estimated readings around 140 mmHg systolic, (although today's reading was 165/100 mmHg).  No chest pain, shortness of breath, leg swelling, fatigue, or dizziness with his current medication regimen. He is quite busy with work, also engaged in Copywriter, advertising projects, such as Advertising copywriter a truck. He acknowledges loud snoring but denies any witnessed apneas or daytime fatigue, though he feels tired around 7:30 to 8:00 PM, which he attributes to waking up at 4 AM.  His social history includes smoking. Now down to less than a pack a day and consuming approximately twelve beers per week. He is attempting to reduce both smoking and alcohol intake. He has a history of congestive heart failure with improved cardiac  function and is not experiencing symptoms such as shortness of breath, leg swelling, or orthopnea.   Today patient denies chest pain, shortness of breath, lower extremity edema, fatigue, palpitations, melena, hematuria, hemoptysis, diaphoresis, weakness, presyncope, syncope, orthopnea, and PND.   Studies Reviewed:      Risk Assessment/Calculations:     HYPERTENSION CONTROL Vitals:   04/11/24 1016 04/11/24 1053  BP: (!) 165/100 (!) 162/106    The patient's blood pressure is elevated above target today.  In order to address the patient's elevated BP:       STOP-Bang Score:  5       Physical Exam:   VS:  BP (!) 162/106   Pulse 71   Ht 5\' 10"  (1.778 m)   Wt 204 lb 12.8 oz (92.9 kg)   SpO2 96%   BMI 29.39 kg/m    Wt Readings from Last 3 Encounters:  04/11/24 204 lb 12.8 oz (92.9 kg)  02/15/24 220 lb 9.6 oz (100.1 kg)  07/05/23 215 lb (97.5 kg)     Physical Exam Vitals reviewed.  Constitutional:      Appearance: Normal appearance.  HENT:     Head: Normocephalic.  Eyes:     Pupils: Pupils are equal, round, and reactive to light.  Cardiovascular:     Rate and Rhythm: Normal rate and regular rhythm.     Pulses: Normal pulses.     Heart sounds: Normal heart sounds.  Pulmonary:     Effort: Pulmonary effort is normal.     Breath sounds: Normal breath sounds.  Abdominal:     General: Abdomen is flat.     Palpations: Abdomen is  soft.  Musculoskeletal:     Right lower leg: No edema.     Left lower leg: No edema.  Skin:    General: Skin is warm and dry.     Capillary Refill: Capillary refill takes less than 2 seconds.  Neurological:     General: No focal deficit present.     Mental Status: He is alert and oriented to person, place, and time.  Psychiatric:        Mood and Affect: Mood normal.        Behavior: Behavior normal.        Thought Content: Thought content normal.        Judgment: Judgment normal.       ASSESSMENT AND PLAN:    Assessment &  Plan Hypertension Hypertension remains uncontrolled with current regimen. Home blood pressure readings are around 140 mmHg systolic, but in-office readings are higher at 165/100 mmHg. Current medications include lisinopril , carvedilol , and spironolactone . Suspected sleep apnea, alcohol, and tobacco use contributing to resistant hypertension. - Difficult situation with patient currently without insurance or PCP. Has not been able to talk with LCSW since our last visit. I suspect he will qualify for Medicaid and today have provided resources to assist with this as well as connect him back with LCSW. - Due to lack of insurance, more difficult to manage BP as lab costs are an issue for patient. Due to this, will not adjust his Lisinopril  or Spironolactone  which would require follow up labwork. Ideally would transition to more potent ARB.  - Increase amlodipine  from 5 mg to 10 mg daily. - Increase carvedilol  from 6.25mg  BID to 12.5mg  BID. - He will see clinical pharmacist for further blood pressure management on Apr 19, 2024. - Order sleep study to evaluate for sleep apnea. - Encourage reduction in alcohol consumption.  Coronary Artery Disease Coronary artery disease with single vessel obstructive disease and total occlusion of a small interlateral branch of the first diagonal vessel of the LAD. No current chest pain or angina symptoms. Discussed how smoking is a significant risk factor for myocardial infarction. Advised to monitor for new or different chest symptoms as smoking increases heart attack risk. - Continue aspirin , atorvastatin , carvedilol , lisinopril , and spironolactone  - Advise on smoking cessation to reduce cardiac risk - Monitor for new or different chest symptoms  Hyperlipidemia Hyperlipidemia is well-controlled with atorvastatin . Last cholesterol check in August 2024 showed good control, LDL 44. - Continue atorvastatin  - Recheck lipid panel at next annual visit  Suspected Sleep  Apnea Suspected sleep apnea based on snoring and daytime fatigue. It may contribute to resistant hypertension and may require CPAP therapy. - Order sleep study to confirm diagnosis of sleep apnea. Will not be able to complete until he is set up with Medicaid.  Congestive Heart Failure Congestive heart failure with improved cardiac function. Currently asymptomatic with no shortness of breath, leg swelling, or orthopnea. - Continue Lisinopril  20mg  BID, Spironolactone  12.5mg  daily.  Tobacco Use Continues to smoke less than a pack per day. Discussed in detail how smoking contributes to hypertension and cardiovascular risk. - Patient seems more interested today in medication assistance with quitting. Will think about it this week and plans to discuss with PharmD next week.  Alcohol Use Consumes approximately 12 beers per week. Alcohol use may contribute to hypertension. - Encourage reduction in alcohol consumption.             Signed, Leala Prince, PA-C

## 2024-04-11 ENCOUNTER — Ambulatory Visit: Payer: Self-pay | Attending: Cardiology | Admitting: Cardiology

## 2024-04-11 ENCOUNTER — Encounter: Payer: Self-pay | Admitting: Cardiology

## 2024-04-11 VITALS — BP 162/106 | HR 71 | Ht 70.0 in | Wt 204.8 lb

## 2024-04-11 DIAGNOSIS — I10 Essential (primary) hypertension: Secondary | ICD-10-CM

## 2024-04-11 DIAGNOSIS — I5032 Chronic diastolic (congestive) heart failure: Secondary | ICD-10-CM | POA: Insufficient documentation

## 2024-04-11 DIAGNOSIS — E782 Mixed hyperlipidemia: Secondary | ICD-10-CM

## 2024-04-11 DIAGNOSIS — Z720 Tobacco use: Secondary | ICD-10-CM

## 2024-04-11 DIAGNOSIS — I2510 Atherosclerotic heart disease of native coronary artery without angina pectoris: Secondary | ICD-10-CM

## 2024-04-11 DIAGNOSIS — I5020 Unspecified systolic (congestive) heart failure: Secondary | ICD-10-CM

## 2024-04-11 MED ORDER — CARVEDILOL 12.5 MG PO TABS: 12.5000 mg | ORAL_TABLET | Freq: Two times a day (BID) | ORAL | 2 refills | Status: AC

## 2024-04-11 MED ORDER — AMLODIPINE BESYLATE 10 MG PO TABS: 10.0000 mg | ORAL_TABLET | Freq: Every day | ORAL | 2 refills | Status: AC

## 2024-04-11 NOTE — Patient Instructions (Signed)
 Medication Instructions:   INCREASE YOUR AMLODIPINE  TO 10 MG BY MOUTH DAILY  INCREASE YOUR CARVEDILOL  (COREG ) TO 12.5 MG BY MOUTH TWICE DAILY  *If you need a refill on your cardiac medications before your next appointment, please call your pharmacy*    Follow-Up:  1.)  2-3 MONTHS WITH AN EXTENDER IN THE OFFICE  2.)  PLEASE KEEP YOUR SCHEDULED APPOINTMENT TO SEE OUR PHARMACIST IN BLOOD PRESSURE CLINIC ON 04/19/24 AT 3:15 PM WITH MELISSA MACCIA RPH--PLEASE BRING YOUR BLOOD PRESSURE LOG TO THIS VISIT   Other Instructions  WE WILL PROVIDE YOU THE PACKET TO INSTRUCT YOU WHERE TO GO TO APPLY FOR MEDICAID ASSISTANCE--PLEASE FOLLOW ALL INSTRUCTIONS ON THIS PACKET

## 2024-04-11 NOTE — Progress Notes (Signed)
 H&V Care Navigation CSW Progress Note  Clinical Social Worker sent information about Medicaid enrollment at [facility] to [place], [place], who assisted with connecting pt to that resource. Previously attempted pt multiple times without return call. Will re-attempt again as able. Per LPN pt planning on going to office tomorrow to apply for Medicaid.  Patient is participating in a Managed Medicaid Plan:  No, self pay  SDOH Screenings   Food Insecurity: No Food Insecurity (02/29/2024)  Housing: Low Risk  (02/29/2024)  Transportation Needs: No Transportation Needs (02/29/2024)  Utilities: Not At Risk (02/29/2024)  Financial Resource Strain: Medium Risk (02/29/2024)  Tobacco Use: High Risk (04/11/2024)  Health Literacy: Adequate Health Literacy (02/29/2024)    Nathen Balder, MSW, LCSW Clinical Social Worker II [facility] Health Heart/Vascular Care Navigation  [phone]- work cell phone (preferred)

## 2024-04-15 ENCOUNTER — Telehealth: Payer: Self-pay | Admitting: Licensed Clinical Social Worker

## 2024-04-15 NOTE — Telephone Encounter (Signed)
 H&V Care Navigation CSW Progress Note  Clinical Social Worker contacted patient by phone to f/u on Medicaid referral as pt had indicated to team that he may go to apply last week. F/u with Henry Walters DSS clinic indicates no pending application. LCSW reached out to preferred number which is now significant other Henry Walters's number [phone]. No updated DPR on file but pt had given me permission verbally to speak with her. Requested that she give pt my number and have him call me back.  Note he has an upcoming appt Friday with pharmD too if still doesn't call back this week.   Patient is participating in a Managed Medicaid Plan:  No, self pay only  SDOH Screenings   Food Insecurity: No Food Insecurity (02/29/2024)  Housing: Low Risk  (02/29/2024)  Transportation Needs: No Transportation Needs (02/29/2024)  Utilities: Not At Risk (02/29/2024)  Financial Resource Strain: Medium Risk (02/29/2024)  Tobacco Use: High Risk (04/11/2024)  Health Literacy: Adequate Health Literacy (02/29/2024)    Henry Walters, MSW, LCSW Clinical Social Worker II [facility] Health Heart/Vascular Care Navigation  [phone]- work cell phone (preferred)

## 2024-04-19 ENCOUNTER — Ambulatory Visit: Payer: Self-pay | Attending: Cardiology | Admitting: Pharmacist

## 2024-04-19 ENCOUNTER — Encounter: Payer: Self-pay | Admitting: Pharmacist

## 2024-04-19 VITALS — BP 150/100 | HR 63

## 2024-04-19 DIAGNOSIS — I10 Essential (primary) hypertension: Secondary | ICD-10-CM

## 2024-04-19 DIAGNOSIS — Z720 Tobacco use: Secondary | ICD-10-CM

## 2024-04-19 NOTE — Assessment & Plan Note (Signed)
 Assessment: Patient smoking less than a pack per day Has cut back Thinks his likelihood of successfully quitting is low because his significant other smokes Discussed speaking with her about quitting together Reviewed the importance of quitting for his health

## 2024-04-19 NOTE — Assessment & Plan Note (Addendum)
 Assessment: Blood pressure in clinic today is significantly elevated Home blood pressures however much better.  Home cuff has not been validated however He denies any dizziness lightheadedness headaches blurred vision shortness of breath or swelling Henry Walters talked with him about applying for Medicaid.  He was given the information about the walk-in clinic in the [facility] medical building that can help him apply  Plan: Since his home blood pressure cuff has not been validated I have asked him to stop by so I can validate this.  He will come next Wednesday afternoon before he goes to the walk-in clinic to apply for Medicaid Continue amlodipine  10 mg daily, carvedilol  12.5 mg twice a day, lisinopril  20 mg twice a day and spironolactone  12.5 mg daily If home cuff is not found to be accurate then adjustments to his medications will be needed I will look into how much cash price a BMP is with [facility]-if this is not affordable then we may have to add hydralazine

## 2024-04-19 NOTE — Progress Notes (Signed)
 Patient ID: Henry Walters                 DOB: 1971-11-14                      MRN: [id number]      HPI: Henry Walters is a 53 y.o. male referred by Leala Prince, PA to HTN clinic. PMH is significant for coronary artery disease and heart failure with recovered ejection fraction, hypertension, hyperlipidemia, tobacco use, alcohol use.   Patient with resistant, diffucilt to control HTN. Patient currently does not have insurance. Labs fees have been an issue for patient, so adjusting medications has been a challenge. At last visit, amlodipine  was increased to 10mg  daily and carvedilol  increased to 12.5mg  twice a day.  Patient presents to the 2 hypertension clinic.  Harland Libman our Child psychotherapist spoke with patient prior to starting her visit about applying for Medicaid.  Patient reports compliance with his medications.  Has had a stressful time lately with the passing of his mother and sister along with his son having a seizure.  4AM: ASA and lisinopril  12:00 Lunch amlodipine , lisinopril , carvedilol  8 PM Night- carvedilol , spironolactone , atorvastatin   He denies dizziness, lightheadedness, headache, blurred vision, SOB, swelling.  He has been checking his blood pressure at home with his sisters old cuff.  It is possible that cuff may have not been placed correctly.  Home blood pressures were fairly well-controlled.  Home cuff has not been validated.   Current HTN meds: amlodipine  10mg  daily, carvedilol  12.5mg  twice a day, lisinopril  20mg  twice a day, spironolactone  12.5mg  daily Previously tried:  BP goal: <130/80  Family History:  Family History  Problem Relation Age of Onset   Diabetes Mother    Healthy Sister     Social History: + tobacco <1 pack per day, 12 pack per week, + marijuana  Diet: coffee - 1 cup, salts food,   Exercise:  Works in [employer]- walks a lot, yard work  Home BP readings:  112/71, 121/80 HR 79 126/85 HR 82 136/89 HR 79   Wt Readings from Last 3  Encounters:  04/11/24 204 lb 12.8 oz (92.9 kg)  02/15/24 220 lb 9.6 oz (100.1 kg)  07/05/23 215 lb (97.5 kg)   BP Readings from Last 3 Encounters:  04/19/24 (!) 150/100  04/11/24 (!) 162/106  02/15/24 (!) 156/106   Pulse Readings from Last 3 Encounters:  04/19/24 63  04/11/24 71  02/15/24 73    Renal function: CrCl cannot be calculated (Patient's most recent lab result is older than the maximum 21 days allowed.).  Past Medical History:  Diagnosis Date   CAD (coronary artery disease)    a. LHC 8/16:  anterolateral br of D1 occluded, mRCA 20%, EF 35-45% >> med Rx   DDD (degenerative disc disease), cervical    History of stress test    Myoview 7/16:  EF 36%, anterior and anterolateral scar with periinfarct ischemia, high risk   Ischemic cardiomyopathy    Echo 12/16: anteroseptal, anterior, apical HK, mild focal basal septal hypertrophy, EF 40-45%, Gr 1 DD   Tobacco abuse     Current Outpatient Medications on File Prior to Visit  Medication Sig Dispense Refill   amLODipine  (NORVASC ) 10 MG tablet Take 1 tablet (10 mg total) by mouth daily. 90 tablet 2   aspirin  81 MG tablet Take 81 mg by mouth daily.     atorvastatin  (LIPITOR) 80 MG tablet Take 1 tablet (80 mg total)  by mouth daily. 90 tablet 3   carvedilol  (COREG ) 12.5 MG tablet Take 1 tablet (12.5 mg total) by mouth 2 (two) times daily. 180 tablet 2   lisinopril  (ZESTRIL ) 20 MG tablet Take 1 tablet (20 mg total) by mouth 2 (two) times daily. 180 tablet 3   nitroGLYCERIN  (NITROSTAT ) 0.4 MG SL tablet PLACE 1 TABLET UNDER THE TONGUE EVERY 5 MINUTES AS NEEDED FOR CHEST PAIN. 25 tablet 11   spironolactone  (ALDACTONE ) 25 MG tablet TAKE 1/2 TABLET BY MOUTH ONCE DAILY 45 tablet 3   albuterol  (VENTOLIN  HFA) 108 (90 Base) MCG/ACT inhaler Inhale 2 puffs into the lungs every 6 (six) hours as needed for wheezing or shortness of breath. (Patient not taking: Reported on 04/19/2024) 6.7 g 3   No current facility-administered medications on file  prior to visit.    No Known Allergies  Blood pressure (!) 150/100, pulse 63.   Assessment/Plan: HYPERTENSION CONTROL Vitals:   04/19/24 1542 04/19/24 1543  BP: (!) 178/100 (!) 150/100    The patient's blood pressure is elevated above target today.  In order to address the patient's elevated BP: Blood pressure will be monitored at home to determine if medication changes need to be made.      1. Hypertension -  Primary hypertension Assessment: Blood pressure in clinic today is significantly elevated Home blood pressures however much better.  Home cuff has not been validated however He denies any dizziness lightheadedness headaches blurred vision shortness of breath or swelling Verlena Glenn talked with him about applying for Medicaid.  He was given the information about the walk-in clinic in the [facility] medical building that can help him apply  Plan: Since his home blood pressure cuff has not been validated I have asked him to stop by so I can validate this.  He will come next Wednesday afternoon before he goes to the walk-in clinic to apply for Medicaid Continue amlodipine  10 mg daily, carvedilol  12.5 mg twice a day, lisinopril  20 mg twice a day and spironolactone  12.5 mg daily If home cuff is not found to be accurate then adjustments to his medications will be needed I will look into how much cash price a BMP is with [facility]-if this is not affordable then we may have to add hydralazine    Thank you  Abigaelle Verley D Triston Skare, Pharm.Monika Annas, CPP [facility] [address] [place], [place] [address]  Phone: [phone]; Fax: [phone]

## 2024-04-19 NOTE — Patient Instructions (Addendum)
 Your blood pressure goal is < 130/48mmHg   Continue amlodipine  10mg  daily, carvedilol  12.5mg  twice a day, lisinopril  20mg  twice a day, spironolactone  12.5mg  daily  Please stop by on Wed with your blood pressure cuff so I can check it [phone]  Important lifestyle changes to control high blood pressure  Intervention  Effect on the BP   Weight loss Weight loss is one of the most effective lifestyle changes for controlling blood pressure. If you're overweight or obese, losing even a small amount of weight can help reduce blood pressure.    Blood pressure can decrease by 1 millimeter of mercury (mmHg) with each kilogram (about 2.2 pounds) of weight lost.   Exercise regularly As a general goal, aim for 30 minutes of moderate physical activity every day.    Regular physical activity can lower blood pressure by 5 - 8 mmHg.   Eat a healthy diet Eat a diet rich in whole grains, fruits, vegetables, lean meat, and low-fat dairy products. Limit processed foods, saturated fat, and sweets.    A heart-healthy diet can lower high blood pressure by 10 mmHg.   Reduce salt (sodium) in your diet Aim for 000mg  of sodium each day. Avoid deli meats, canned food, and frozen microwave meals which are high in sodium.     Limiting sodium can reduce blood pressure by 5 mmHg.   Limit alcohol One drink equals 12 ounces of beer, 5 ounces of wine, or 1.5 ounces of 80-proof liquor.    Limiting alcohol to < 1 drink a day for women or < 2 drinks a day for men can help lower blood pressure by about 4 mmHg.   To check your pressure at home you will need to:   Sit up in a chair, with feet flat on the floor and back supported. Do not cross your ankles or legs. Rest your left arm so that the cuff is about heart level. If the cuff goes on your upper arm, then just relax your arm on the table, arm of the chair, or your lap. If you have a wrist cuff, hold your wrist against your chest at heart level. Place  the cuff snugly around your arm, about 1 inch above the crease of your elbow. The cords should be inside the groove of your elbow.  Sit quietly, with the cuff in place, for about 5 minutes. Then press the power button to start a reading. Do not talk or move while the reading is taking place.  Record your readings on a sheet of paper. Although most cuffs have a memory, it is often easier to see a pattern developing when the numbers are all in front of you.  You can repeat the reading after 1-3 minutes if it is recommended.   Make sure your bladder is empty and you have not had caffeine or tobacco within the last 30 minutes   Always bring your blood pressure log with you to your appointments. If you have not brought your monitor in to be double checked for accuracy, please bring it to your next appointment.   You can find a list of validated (accurate) blood pressure cuffs at: validatebp.org

## 2024-04-23 NOTE — Progress Notes (Signed)
 H&V Care Navigation CSW Progress Note  Clinical Social Worker met with patient to f/u again and provide information regarding Medicaid clinic at [facility] and [employer] forms. Explained how each works and encouraged pt to consider completing both with our assistance/assistance of DSS caseworkers. No additional questions at this time. Will follow for any additional needs.  Patient is participating in a Managed Medicaid Plan:  No, self pay only  SDOH Screenings   Food Insecurity: No Food Insecurity (02/29/2024)  Housing: Low Risk  (02/29/2024)  Transportation Needs: No Transportation Needs (02/29/2024)  Utilities: Not At Risk (02/29/2024)  Financial Resource Strain: Medium Risk (02/29/2024)  Tobacco Use: High Risk (04/19/2024)  Health Literacy: Adequate Health Literacy (02/29/2024)   Nathen Balder, MSW, LCSW Clinical Social Worker II [facility] Health Heart/Vascular Care Navigation  [phone]- work cell phone (preferred)

## 2024-04-25 ENCOUNTER — Telehealth: Payer: Self-pay | Admitting: Pharmacist

## 2024-04-25 MED ORDER — SPIRONOLACTONE 25 MG PO TABS: 25.0000 mg | ORAL_TABLET | Freq: Every day | ORAL | 11 refills | Status: AC

## 2024-04-25 NOTE — Telephone Encounter (Signed)
 Reli on upper arm cuff  Home 143/97 HR 77 Home 122/86 HR 79  Clinic 140/92 Clinic 140/98  Home 134/95 Home 137/90  Clinic 138/95  At home rests for 15-20 min

## 2024-04-26 ENCOUNTER — Telehealth (HOSPITAL_BASED_OUTPATIENT_CLINIC_OR_DEPARTMENT_OTHER): Payer: Self-pay | Admitting: Licensed Clinical Social Worker

## 2024-04-26 NOTE — Telephone Encounter (Signed)
 H&V Care Navigation CSW Progress Note  Clinical Social Worker received confirmation that pt had applied for Medicaid with assistance of [place] DSS caseworker Joi at [facility] on 5/29. Pending additional updates at this time.  Patient is participating in a Managed Medicaid Plan:  No, self pay, pending Medicaid application  SDOH Screenings   Food Insecurity: No Food Insecurity (02/29/2024)  Housing: Low Risk  (02/29/2024)  Transportation Needs: No Transportation Needs (02/29/2024)  Utilities: Not At Risk (02/29/2024)  Financial Resource Strain: Medium Risk (02/29/2024)  Tobacco Use: High Risk (04/19/2024)  Health Literacy: Adequate Health Literacy (02/29/2024)    Nathen Balder, MSW, LCSW Clinical Social Worker II [facility] Health Heart/Vascular Care Navigation  [phone]- work cell phone (preferred)

## 2024-05-10 ENCOUNTER — Ambulatory Visit: Payer: Self-pay | Attending: Cardiovascular Disease | Admitting: Pharmacist

## 2024-05-10 ENCOUNTER — Other Ambulatory Visit: Payer: Self-pay

## 2024-05-10 VITALS — BP 120/82 | HR 77

## 2024-05-10 DIAGNOSIS — I10 Essential (primary) hypertension: Secondary | ICD-10-CM

## 2024-05-10 NOTE — Progress Notes (Signed)
 Patient ID: [name]                 DOB: 1971-07-19                      MRN: [id number]      HPI: Henry Walters is a 53 y.o. male referred by Henry Prince, PA to HTN clinic. PMH is significant for coronary artery disease and heart failure with recovered ejection fraction, hypertension, hyperlipidemia, tobacco use, alcohol use.   Patient with resistant, diffucilt to control HTN. Patient currently does not have insurance. Labs fees have been an issue for patient, so adjusting medications has been a challenge. At last visit, amlodipine  was increased to 10mg  daily and carvedilol  increased to 12.5mg  twice a day.  Patient seen in HTN clinic 04/19/24. BP was 150/100, home readings were better. He brought in his home BP cuff for verification the following week.  Home cuff was found to be accurate.  Spironolactone  was increased to 25mg  daily.   Patient presents today for follow-up.  He brings in blood pressure readings.  All at goal.  He had more but the paper got ripped up.  Reports that they were all at goal.  Denies any dizziness lightheadedness, headaches, blurred vision, swelling, shortness of breath, palpitations.  Patient took all his medications today and his last cigarette was about 45 minutes to an hour ago He was denied for Medicaid.  He will reach out to Henry Walters on Monday to follow-up on Henry Walters financial aid.  4AM: ASA and lisinopril  12:00 Lunch amlodipine , lisinopril , carvedilol  8 PM Night- carvedilol , spironolactone , atorvastatin   Current HTN meds: amlodipine  10mg  daily, carvedilol  12.5mg  twice a day, lisinopril  20mg  twice a day, spironolactone  25mg  daily Previously tried:  BP goal: <130/80  Family History:  Family History  Problem Relation Age of Onset   Diabetes Mother    Healthy Sister     Social History: + tobacco <1 pack per day, 12 pack per week, + marijuana  Diet: coffee - 1 cup, salts food,   Exercise:  Works in [employer]- walks a lot, yard  work  Home BP readings:  122/79, 111/76, 123/77 HR 68, 80, 78   Wt Readings from Last 3 Encounters:  04/11/24 204 lb 12.8 oz (92.9 kg)  02/15/24 220 lb 9.6 oz (100.1 kg)  07/05/23 215 lb (97.5 kg)   BP Readings from Last 3 Encounters:  05/10/24 120/82  04/19/24 (!) 150/100  04/11/24 (!) 162/106   Pulse Readings from Last 3 Encounters:  05/10/24 77  04/19/24 63  04/11/24 71    Renal function: CrCl cannot be calculated (Patient's most recent lab result is older than the maximum 21 days allowed.).  Past Medical History:  Diagnosis Date   CAD (coronary artery disease)    a. LHC 8/16:  anterolateral br of D1 occluded, mRCA 20%, EF 35-45% >> med Rx   DDD (degenerative disc disease), cervical    History of stress test    Myoview 7/16:  EF 36%, anterior and anterolateral scar with periinfarct ischemia, high risk   Ischemic cardiomyopathy    Echo 12/16: anteroseptal, anterior, apical HK, mild focal basal septal hypertrophy, EF 40-45%, Gr 1 DD   Tobacco abuse     Current Outpatient Medications on File Prior to Visit  Medication Sig Dispense Refill   albuterol  (VENTOLIN  HFA) 108 (90 Base) MCG/ACT inhaler Inhale 2 puffs into the lungs every 6 (six) hours as needed for wheezing or shortness  of breath. (Patient not taking: Reported on 04/19/2024) 6.7 g 3   amLODipine  (NORVASC ) 10 MG tablet Take 1 tablet (10 mg total) by mouth daily. 90 tablet 2   aspirin  81 MG tablet Take 81 mg by mouth daily.     atorvastatin  (LIPITOR) 80 MG tablet Take 1 tablet (80 mg total) by mouth daily. 90 tablet 3   carvedilol  (COREG ) 12.5 MG tablet Take 1 tablet (12.5 mg total) by mouth 2 (two) times daily. 180 tablet 2   lisinopril  (ZESTRIL ) 20 MG tablet Take 1 tablet (20 mg total) by mouth 2 (two) times daily. 180 tablet 3   nitroGLYCERIN  (NITROSTAT ) 0.4 MG SL tablet PLACE 1 TABLET UNDER THE TONGUE EVERY 5 MINUTES AS NEEDED FOR CHEST PAIN. 25 tablet 11   spironolactone  (ALDACTONE ) 25 MG tablet Take 1 tablet  (25 mg total) by mouth daily. 30 tablet 11   No current facility-administered medications on file prior to visit.    No Known Allergies  Blood pressure 120/82, pulse 77.   Assessment/Plan:     1. Hypertension -  Primary hypertension Assessment: Blood pressure very close to goal today in clinic Home readings have been at goal He needs a BMP due to increasing spironolactone   Plan: BMP today Continue amlodipine  10mg  daily, carvedilol  12.5mg  twice a day, lisinopril  20mg  twice a day, spironolactone  25mg  daily Continue to monitor blood pressure at home Follow-up with lab results    Thank you  Henry Walters D Henry Walters, Pharm.Henry Walters, CPP [facility] [address], [place], [place] [address]  Phone: [phone]; Fax: [phone]

## 2024-05-10 NOTE — Patient Instructions (Signed)
 Your blood pressure goal is < 130/24mmHg   Continue amlodipine  10mg  daily, carvedilol  12.5mg  twice a day, lisinopril  20mg  twice a day, spironolactone  25mg  daily  Important lifestyle changes to control high blood pressure  Intervention  Effect on the BP   Weight loss Weight loss is one of the most effective lifestyle changes for controlling blood pressure. If you're overweight or obese, losing even a small amount of weight can help reduce blood pressure.    Blood pressure can decrease by 1 millimeter of mercury (mmHg) with each kilogram (about 2.2 pounds) of weight lost.   Exercise regularly As a general goal, aim for 30 minutes of moderate physical activity every day.    Regular physical activity can lower blood pressure by 5 - 8 mmHg.   Eat a healthy diet Eat a diet rich in whole grains, fruits, vegetables, lean meat, and low-fat dairy products. Limit processed foods, saturated fat, and sweets.    A heart-healthy diet can lower high blood pressure by 10 mmHg.   Reduce salt (sodium) in your diet Aim for 000mg  of sodium each day. Avoid deli meats, canned food, and frozen microwave meals which are high in sodium.     Limiting sodium can reduce blood pressure by 5 mmHg.   Limit alcohol One drink equals 12 ounces of beer, 5 ounces of wine, or 1.5 ounces of 80-proof liquor.    Limiting alcohol to < 1 drink a day for women or < 2 drinks a day for men can help lower blood pressure by about 4 mmHg.   To check your pressure at home you will need to:   Sit up in a chair, with feet flat on the floor and back supported. Do not cross your ankles or legs. Rest your left arm so that the cuff is about heart level. If the cuff goes on your upper arm, then just relax your arm on the table, arm of the chair, or your lap. If you have a wrist cuff, hold your wrist against your chest at heart level. Place the cuff snugly around your arm, about 1 inch above the crease of your elbow. The cords  should be inside the groove of your elbow.  Sit quietly, with the cuff in place, for about 5 minutes. Then press the power button to start a reading. Do not talk or move while the reading is taking place.  Record your readings on a sheet of paper. Although most cuffs have a memory, it is often easier to see a pattern developing when the numbers are all in front of you.  You can repeat the reading after 1-3 minutes if it is recommended.   Make sure your bladder is empty and you have not had caffeine or tobacco within the last 30 minutes   Always bring your blood pressure log with you to your appointments. If you have not brought your monitor in to be double checked for accuracy, please bring it to your next appointment.   You can find a list of validated (accurate) blood pressure cuffs at: validatebp.org

## 2024-05-10 NOTE — Assessment & Plan Note (Signed)
 Assessment: Blood pressure very close to goal today in clinic Home readings have been at goal He needs a BMP due to increasing spironolactone   Plan: BMP today Continue amlodipine  10mg  daily, carvedilol  12.5mg  twice a day, lisinopril  20mg  twice a day, spironolactone  25mg  daily Continue to monitor blood pressure at home Follow-up with lab results

## 2024-05-11 LAB — BASIC METABOLIC PANEL WITH GFR
BUN/Creatinine Ratio: 18 (ref 9–20)
BUN: 16 mg/dL (ref 6–24)
CO2: 23 mmol/L (ref 20–29)
Calcium: 9.3 mg/dL (ref 8.7–10.2)
Chloride: 101 mmol/L (ref 96–106)
Creatinine, Ser: 0.89 mg/dL (ref 0.76–1.27)
Glucose: 89 mg/dL (ref 70–99)
Potassium: 4.9 mmol/L (ref 3.5–5.2)
Sodium: 139 mmol/L (ref 134–144)
eGFR: 103 mL/min/{1.73_m2} (ref >59)

## 2024-05-13 ENCOUNTER — Ambulatory Visit: Payer: Self-pay | Admitting: Pharmacist

## 2024-05-15 ENCOUNTER — Telehealth (HOSPITAL_BASED_OUTPATIENT_CLINIC_OR_DEPARTMENT_OTHER): Payer: Self-pay | Admitting: Licensed Clinical Social Worker

## 2024-05-15 ENCOUNTER — Telehealth: Payer: Self-pay | Admitting: Pharmacist

## 2024-05-15 NOTE — Telephone Encounter (Signed)
 H&V Care Navigation CSW Progress Note  Clinical Social Worker contacted patient by phone to f/u on denial for full Medicaid, was only approved for [employer] Was able to reach [place], pt significant other (verbal permission from pt to speak with her) and she is agreeable to paperwork being sent to his home address. Will re-send [employer], [employer] and [employer] Card to home address on file, confirmed today.  Patient is participating in a Managed Medicaid Plan:  No, self pay, family planning only  SDOH Screenings   Food Insecurity: No Food Insecurity (02/29/2024)  Housing: Low Risk  (02/29/2024)  Transportation Needs: No Transportation Needs (02/29/2024)  Utilities: Not At Risk (02/29/2024)  Financial Resource Strain: Medium Risk (02/29/2024)  Tobacco Use: High Risk (04/19/2024)  Health Literacy: Adequate Health Literacy (02/29/2024)

## 2024-05-15 NOTE — Telephone Encounter (Signed)
 BMP stable after increase spironolactone . He can f/u with Callie in July. LVM for him to call back

## 2024-05-20 ENCOUNTER — Telehealth: Payer: Self-pay | Admitting: Licensed Clinical Social Worker

## 2024-05-20 NOTE — Telephone Encounter (Signed)
 H&V Care Navigation CSW Progress Note  Clinical Social Worker received a call from pt significant other Linden Ditch (verbal permission given by [phone]) to check in on Medicaid card they received. Shared that the family planning medicaid cards look like full Medicaid however when benefits run he only has family planning at this time but encouraged them to keep and bring card to appts as there are some covered services. Provided reminder that I had sent through a new copy of [employer] and [employer] and encouraged him to complete those/for he or Kandi to let me know when received. No additional questions today.  Patient is participating in a Managed Medicaid Plan:  No, family planning only  SDOH Screenings   Food Insecurity: No Food Insecurity (02/29/2024)  Housing: Low Risk  (02/29/2024)  Transportation Needs: No Transportation Needs (02/29/2024)  Utilities: Not At Risk (02/29/2024)  Financial Resource Strain: Medium Risk (02/29/2024)  Tobacco Use: High Risk (04/19/2024)  Health Literacy: Adequate Health Literacy (02/29/2024)    Marit Lark, MSW, LCSW Clinical Social Worker II [facility] Health Heart/Vascular Care Navigation  [phone]- work cell phone (preferred)

## 2024-05-20 NOTE — Telephone Encounter (Signed)
 H&V Care Navigation CSW Progress Note  Clinical Social Worker received a call from pt [phone]) and explained what I had shared with Kandi and encouraged him to let me know when he has received the applications sent to him. Encouraged him to also let me know if not received so we can coordinate pt getting those applications some other way. No additional questions today.  Patient is participating in a Managed Medicaid Plan:  No, self pay only, family planning  SDOH Screenings   Food Insecurity: No Food Insecurity (02/29/2024)  Housing: Low Risk  (02/29/2024)  Transportation Needs: No Transportation Needs (02/29/2024)  Utilities: Not At Risk (02/29/2024)  Financial Resource Strain: Medium Risk (02/29/2024)  Tobacco Use: High Risk (04/19/2024)  Health Literacy: Adequate Health Literacy (02/29/2024)    Marit Lark, MSW, LCSW Clinical Social Worker II [facility] Health Heart/Vascular Care Navigation  [phone]- work cell phone (preferred)

## 2024-05-28 ENCOUNTER — Telehealth: Payer: Self-pay | Admitting: Licensed Clinical Social Worker

## 2024-05-28 NOTE — Telephone Encounter (Signed)
 H&V Care Navigation CSW Progress Note  Clinical Social Worker contacted patient by phone to f/u on assistance applications sent to pt since not eligible for full Medicaid. No answer at [phone]. Left voicemail. Will re-attempt again as able.  Patient is participating in a Managed Medicaid Plan:  No, Family Planning only  SDOH Screenings   Food Insecurity: No Food Insecurity (02/29/2024)  Housing: Low Risk  (02/29/2024)  Transportation Needs: No Transportation Needs (02/29/2024)  Utilities: Not At Risk (02/29/2024)  Financial Resource Strain: Medium Risk (02/29/2024)  Tobacco Use: High Risk (04/19/2024)  Health Literacy: Adequate Health Literacy (02/29/2024)    Marit Lark, MSW, LCSW Clinical Social Worker II [facility] Health Heart/Vascular Care Navigation  [phone]- work cell phone (preferred)

## 2024-06-04 ENCOUNTER — Telehealth (HOSPITAL_BASED_OUTPATIENT_CLINIC_OR_DEPARTMENT_OTHER): Payer: Self-pay | Admitting: Licensed Clinical Social Worker

## 2024-06-04 NOTE — Telephone Encounter (Signed)
 H&V Care Navigation CSW Progress Note  Clinical Social Worker contacted patient by phone to f/u on assistance applications sent to pt since not eligible for full Medicaid. No answer at [phone]. Left voicemail. I have attempted pt and pt partner Linden several times over the course of June and July to provide assistance with applications since pt is not eligible for full Medicaid at this time. I remain available as needed.   Patient is participating in a Managed Medicaid Plan:  No, family planning only  SDOH Screenings   Food Insecurity: No Food Insecurity (02/29/2024)  Housing: Low Risk  (02/29/2024)  Transportation Needs: No Transportation Needs (02/29/2024)  Utilities: Not At Risk (02/29/2024)  Financial Resource Strain: Medium Risk (02/29/2024)  Tobacco Use: High Risk (04/19/2024)  Health Literacy: Adequate Health Literacy (02/29/2024)    Marit Lark, MSW, LCSW Clinical Social Worker II [facility] Health Heart/Vascular Care Navigation  [phone]- work cell phone (preferred)

## 2024-06-06 NOTE — Telephone Encounter (Signed)
 H&V Care Navigation CSW Progress Note  Clinical Social Worker received a call back from pt to confirm he received applications. He noted where he needs to complete and what documents to gather. He was advised to contact me before submitting to confirm all paperwork completed and submitted. Confirmed pt filed taxes this year, and he understands he needs to provide a copy of that information. Remain available for any ongoing questions..  Patient is participating in a Managed Medicaid Plan:  No, family planning only  SDOH Screenings   Food Insecurity: No Food Insecurity (02/29/2024)  Housing: Low Risk  (02/29/2024)  Transportation Needs: No Transportation Needs (02/29/2024)  Utilities: Not At Risk (02/29/2024)  Financial Resource Strain: Medium Risk (02/29/2024)  Tobacco Use: High Risk (04/19/2024)  Health Literacy: Adequate Health Literacy (02/29/2024)    Marit Lark, MSW, LCSW Clinical Social Worker II [facility] Health Heart/Vascular Care Navigation  [phone]- work cell phone (preferred)

## 2024-06-10 NOTE — Progress Notes (Deleted)
 Cardiology Office Note:    Date:  06/10/2024   ID:  Henry Walters, DOB February 12, 1971, MRN [id number]  PCP:  Patient, No Pcp Per  Cardiologist:  Maude Emmer, MD { Click to update primary MD,subspecialty MD or APP then REFRESH:1}    Referring MD: No ref. provider found   Chief Complaint: follow-up of hypertension  History of Present Illness:    Henry Walters is a 53 y.o. male with a history of CAD with CTO of lateral 1st Diag noted on cardiac catheterization in 06/2015 (treated medically), ischemic cardiomyopathy/ chronic HFmrEF with EF of 40-45% in 2016 but improved to 55-60% on last Echo in 2021, hypertension, hyperlipidemia, tobacco abuse, and alcohol abuse who is followed by Dr. Emmer and presents today for follow-up of hypertension.  Patient was previously followed by Dr. Dominick and is now followed by Dr. Emmer. She was first seen by Cardiology in 03/2015 for further evaluation of chest pain and abnormal EKG. Myoview was ordered and was high risk with a large fixed anterior and anterolateral perfusion defection suggestive of LAD territory scar and LVEF of 36%. This led to a cardiac catheterization in 06/2015 which showed 100% stenosis of lateral 1st Diag with faint collaterals and 20% stenosis of mid RCA. 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 Medical therapy was recommended given patient was asymptomatic at that time. Echo in 10/2025 showed LVEF of 40-45% with hypokinesis of the mid anteroseptal, anterior, apical septal, anterior, and lateral walls of the true apex. She was started on GDMT. Last Echo in 02/2020 showed improvement of LVEF to 55-60% with no regional wall motion abnormalities and mild asymmetric LVH of the basal-septal segment, normal RV function, and no significant valvular disease. He has also had multiple ETT since 2016 - the last one was in 04/2022 which was normal with no evidence  of ischemia.  He was last seen by Artist Pouch, PA-C, in 03/2024 at which time he denied any cardiac symptoms. However, his BP was uncontrolled. Amlodipine  and Coreg  were increased. Management was complicated by lack of insurance and a PCP. He was seen by our Child psychotherapist that day and given information for Medicaid enrollment. He was suspected to have sleep apnea and plan was to get a sleep study once enrolled in [place]. Since this visit, he has been seen by in our PharmD HTN Clinic twice. BP was close to goal at last visit in 04/2024.  Patient presents today for follow-up. ***  Hypertension BP *** - Continue current medications: Amlodipine  10mg  daily, Coreg  12.5mg  twice daily, Lisinopril  20mg  daily, and Spironolactone  25mg  daily.   CAD LHC in 2016 showed 100% stenosis of lateral 1st Diag with faint collaterals and 20% stenosis of mid RCA. 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 Medical therapy was recommended. - No chest pain.  - Continue aspirin  and high-intensity statin.  Ischemic Cardiomyopathy Chronic HFrEF with Recovered EF EF as low as 36% on Myoview and 40-45% on Echo in 2016. EF has subsequently normalized with GDMT. Last Echo in 2021 showed LVEF to 55-60% with no regional wall motion abnormalities and mild asymmetric LVH of the basal-septal segment, normal RV function, and no significant valvular disease.  - Euvolemic on exam. *** - Continue Lisinopril  20mg  daily.  - Continue Coreg  12.5mg  twice daily. - Continue Spironolactone   25mg  daily.   Hyperlipidemia Lipid panel in 06/2023: Total Cholesterol 105, Triglycerides 81, HDL 45, LDL 44. LDL goal <70 given CAD. - Continue Lipitor 80mg  daily.   Tobacco Abuse ***  Alcohol Abuse ***  EKGs/Labs/Other Studies Reviewed:    The following studies were reviewed:  Left Cardiac Catheterization 07/03/2015: Mid RCA lesion, 20% stenosed. Lat  1st Diag lesion, 100% stenosed. There is moderate left ventricular systolic dysfunction.   Moderate left ventricular dysfunction with moderate hypo-contractility involving the mid distal anterolateral wall and focal severe hypocontractility to akinesis at the apex.  Ejection fraction is 35-40%.   Predominant single-vessel coronary obstructive disease with total occlusion of a small anterolateral branch of the first diagonal vessel of the LAD with faint collaterals of this diagonal branches extending to the apex.  This branch is very small caliber and was ~ 1.5 mm in diameter.   Normal left circumflex coronary artery.   Dominant RCA with mild 20% eccentric stenosis proximal to the acute margin.   Recommendation:  The patient apparently suffered a remote anterolateral infarct secondary to total occlusion of a branch of the first diagonal vessel of the LAD.  There is predominant scar noted on nuclear imaging with mild peri-infarction ischemia.  The patient is asymptomatic.  Medical therapy will be recommended with initiation of nitrates, beta blocker therapy, and consider initiation of ace inhibition in light of LV dysfunction.  Smoking cessation is imperative. _______________  Echocardiogram 11/09/2015: Study Conclusions: - Left ventricle: There is hypokinesis of the mid anteroseptal,    anterior, apical septal, anterior and lateral walls and of the    true apex. No thrombus is seen in the apex. The cavity size was    normal. There was mild focal basal and mild concentric    hypertrophy of the septum. Systolic function was mildly to    moderately reduced. The estimated ejection fraction was in the    range of 40% to 45%. Wall motion was normal; there were no    regional wall motion abnormalities. Doppler parameters are    consistent with abnormal left ventricular relaxation (grade 1    diastolic dysfunction). There was no evidence of elevated    ventricular filling pressure by Doppler  parameters.  - Aortic valve: Trileaflet; normal thickness leaflets. There was no    regurgitation.  - Aortic root: The aortic root was normal in size.  - Mitral valve: Structurally normal valve. There was no    regurgitation.  - Left atrium: The atrium was normal in size.  - Right ventricle: The cavity size was normal. Wall thickness was    normal. Systolic function was normal.  - Right atrium: The atrium was normal in size.  - Tricuspid valve: There was no regurgitation.  - Pulmonic valve: There was no regurgitation.  - Pulmonary arteries: Systolic pressure was within the normal    range.  - Inferior vena cava: The vessel was normal in size.  - Pericardium, extracardiac: There was no pericardial effusion.  _______________  Echocardiogram 03/06/2020: Impressions: 1. Left ventricular ejection fraction, by estimation, is 55 to 60%. Left  ventricular ejection fraction by 3D volume is 56 %. The left ventricle has  normal function. The left ventricle has no regional wall motion  abnormalities. There is mild asymmetric  left ventricular hypertrophy of the basal-septal segment. Left ventricular  diastolic parameters were normal. The average left ventricular global  longitudinal strain is -19.5 %.   2. Right ventricular systolic function is normal. The right ventricular  size is normal. There is normal pulmonary artery systolic pressure.   3. The mitral valve is normal in structure. Trivial mitral valve  regurgitation.   4. The aortic valve is tricuspid. Aortic valve regurgitation is not  visualized. No aortic stenosis is present.  _______________  Exercise Tolerance Test 05/12/2022:   No ST deviation was noted.   Prior study available for comparison.   [place] walked on a standard Bruce protocol treadmill test for 9 minutes and 58 seconds.  He achieved a peak heart rate of 148 which is 87% predicted maximal heart rate.   At peak exercise there were no ST or T wave changes to suggest  ischemia.  He had no QRS widening at peak exercise.   He had a hypertensive response to exercise.   This is  interpreted as a normal stress test.  No evidence of ischemia.  EKG:  EKG ordered today. EKG personally reviewed and demonstrates ***.  Recent Labs: 07/10/2023: ALT 22; Hemoglobin 15.4; Platelets 248; TSH 1.840 05/10/2024: BUN 16; Creatinine, Ser 0.89; Potassium 4.9; Sodium 139  Recent Lipid Panel    Component Value Date/Time   CHOL 105 07/10/2023 0810   TRIG 81 07/10/2023 0810   HDL 45 07/10/2023 0810   CHOLHDL 2.3 07/10/2023 0810   CHOLHDL 3.5 11/08/2016 0745   VLDL 24 11/08/2016 0745   LDLCALC 44 07/10/2023 0810   LDLDIRECT 119.0 04/17/2015 1113    Physical Exam:    Vital Signs: There were no vitals taken for this visit.    Wt Readings from Last 3 Encounters:  04/11/24 204 lb 12.8 oz (92.9 kg)  02/15/24 220 lb 9.6 oz (100.1 kg)  07/05/23 215 lb (97.5 kg)     General: 53 y.o. male in no acute distress. HEENT: Normocephalic and atraumatic. Sclera clear.  Neck: Supple. No carotid bruits. No JVD. Heart: *** RRR. Distinct S1 and S2. No murmurs, gallops, or rubs.  Lungs: No increased work of breathing. Clear to ausculation bilaterally. No wheezes, rhonchi, or rales.  Abdomen: Soft, non-distended, and non-tender to palpation.  Extremities: No lower extremity edema.  Radial and distal pedal pulses 2+ and equal bilaterally. Skin: Warm and dry. Neuro: No focal deficits. Psych: Normal affect. Responds appropriately.   Assessment:    No diagnosis found.  Plan:     Disposition: Follow up in ***   Signed, Aline FORBES Door, PA-C  06/10/2024 4:26 PM    [facility]

## 2024-06-17 ENCOUNTER — Telehealth: Payer: Self-pay | Admitting: Licensed Clinical Social Worker

## 2024-06-17 NOTE — Telephone Encounter (Signed)
 H&V Care Navigation CSW Progress Note  Clinical Social Worker contacted patient by phone and siginificant other Kandi to f/u on assistance applications. Left voicemail for pt at [phone]; return call from [place]. Encouraged her to have pt call me with any questions about paperwork. Linden shares that she also received confirmation about his appt on Monday 7/28. No additional questions at this time.   Patient is participating in a Managed Medicaid Plan:  No, family planning only  SDOH Screenings   Food Insecurity: No Food Insecurity (02/29/2024)  Housing: Low Risk  (02/29/2024)  Transportation Needs: No Transportation Needs (02/29/2024)  Utilities: Not At Risk (02/29/2024)  Financial Resource Strain: Medium Risk (02/29/2024)  Tobacco Use: High Risk (04/19/2024)  Health Literacy: Adequate Health Literacy (02/29/2024)    Marit Lark, MSW, LCSW Clinical Social Worker II [facility] Navigation  [phone]- work cell phone (preferred)

## 2024-06-24 ENCOUNTER — Ambulatory Visit: Payer: Self-pay | Attending: Student | Admitting: Student

## 2024-08-14 ENCOUNTER — Other Ambulatory Visit: Payer: Self-pay

## 2024-08-14 MED ORDER — LISINOPRIL 20 MG PO TABS: 20.0000 mg | ORAL_TABLET | Freq: Two times a day (BID) | ORAL | 3 refills | Status: AC

## 2024-09-12 ENCOUNTER — Other Ambulatory Visit: Payer: Self-pay

## 2024-09-12 DIAGNOSIS — E785 Hyperlipidemia, unspecified: Secondary | ICD-10-CM

## 2024-09-16 ENCOUNTER — Other Ambulatory Visit: Payer: Self-pay | Admitting: Cardiovascular Disease

## 2024-09-16 DIAGNOSIS — E785 Hyperlipidemia, unspecified: Secondary | ICD-10-CM

## 2024-09-16 MED ORDER — ATORVASTATIN CALCIUM 80 MG PO TABS: 80.0000 mg | ORAL_TABLET | Freq: Every day | ORAL | 2 refills | Status: DC

## 2024-09-17 MED ORDER — ATORVASTATIN CALCIUM 80 MG PO TABS: 80.0000 mg | ORAL_TABLET | Freq: Every day | ORAL | 2 refills | Status: AC
